# Patient Record
Sex: Female | Born: 1935 | Race: White | Hispanic: No | Marital: Married | Smoking: Former smoker
Health system: Southern US, Community
[De-identification: ages and names within clinical notes are randomized; demographics above are authoritative.]

## PROBLEM LIST (undated history)

## (undated) DIAGNOSIS — I5020 Unspecified systolic (congestive) heart failure: Secondary | ICD-10-CM

## (undated) DIAGNOSIS — I4891 Unspecified atrial fibrillation: Secondary | ICD-10-CM

## (undated) DIAGNOSIS — E876 Hypokalemia: Secondary | ICD-10-CM

## (undated) DIAGNOSIS — R600 Localized edema: Secondary | ICD-10-CM

## (undated) DIAGNOSIS — R1030 Lower abdominal pain, unspecified: Secondary | ICD-10-CM

## (undated) DIAGNOSIS — Z7901 Long term (current) use of anticoagulants: Secondary | ICD-10-CM

## (undated) DIAGNOSIS — E7800 Pure hypercholesterolemia, unspecified: Secondary | ICD-10-CM

## (undated) DIAGNOSIS — I447 Left bundle-branch block, unspecified: Secondary | ICD-10-CM

## (undated) DIAGNOSIS — R0600 Dyspnea, unspecified: Secondary | ICD-10-CM

## (undated) DIAGNOSIS — K649 Unspecified hemorrhoids: Secondary | ICD-10-CM

## (undated) DIAGNOSIS — K589 Irritable bowel syndrome without diarrhea: Secondary | ICD-10-CM

## (undated) DIAGNOSIS — R05 Cough: Secondary | ICD-10-CM

## (undated) DIAGNOSIS — J189 Pneumonia, unspecified organism: Secondary | ICD-10-CM

## (undated) DIAGNOSIS — E871 Hypo-osmolality and hyponatremia: Principal | ICD-10-CM

## (undated) DIAGNOSIS — R531 Weakness: Secondary | ICD-10-CM

## (undated) DIAGNOSIS — I472 Ventricular tachycardia: Secondary | ICD-10-CM

## (undated) DIAGNOSIS — I25810 Atherosclerosis of coronary artery bypass graft(s) without angina pectoris: Secondary | ICD-10-CM

## (undated) DIAGNOSIS — R5381 Other malaise: Secondary | ICD-10-CM

## (undated) HISTORY — DX: Hypo-osmolality and hyponatremia: E87.1

## (undated) HISTORY — DX: Hypokalemia: E87.6

## (undated) HISTORY — DX: Dyspnea, unspecified: R06.00

## (undated) HISTORY — DX: Pneumonia, unspecified organism: J18.9

## (undated) HISTORY — DX: Atherosclerosis of coronary artery bypass graft(s) without angina pectoris: I25.810

## (undated) HISTORY — DX: Left bundle-branch block, unspecified: I44.7

## (undated) HISTORY — DX: Unspecified atrial fibrillation: I48.91

## (undated) HISTORY — DX: Lower abdominal pain, unspecified: R10.30

## (undated) HISTORY — DX: Unspecified systolic (congestive) heart failure: I50.20

## (undated) HISTORY — PX: TONSILLECTOMY AND ADENOIDECTOMY: SHX28

## (undated) HISTORY — DX: Localized edema: R60.0

## (undated) HISTORY — DX: Irritable bowel syndrome without diarrhea: K58.9

## (undated) HISTORY — DX: Cough: R05

## (undated) HISTORY — DX: Weakness: R53.1

## (undated) HISTORY — DX: Pure hypercholesterolemia, unspecified: E78.00

## (undated) HISTORY — PX: OTHER SURGICAL HISTORY: SHX169

## (undated) HISTORY — DX: Other malaise: R53.81

## (undated) HISTORY — DX: Ventricular tachycardia: I47.2

## (undated) HISTORY — DX: Long term (current) use of anticoagulants: Z79.01

---

## 1997-11-21 ENCOUNTER — Other Ambulatory Visit: Admission: RE | Admit: 1997-11-21 | Discharge: 1997-11-21 | Payer: Self-pay | Admitting: Obstetrics and Gynecology

## 1998-10-21 ENCOUNTER — Ambulatory Visit (HOSPITAL_COMMUNITY): Admission: RE | Admit: 1998-10-21 | Discharge: 1998-10-21 | Payer: Self-pay | Admitting: Gastroenterology

## 1998-10-21 ENCOUNTER — Encounter (INDEPENDENT_AMBULATORY_CARE_PROVIDER_SITE_OTHER): Payer: Self-pay | Admitting: Specialist

## 1999-03-29 ENCOUNTER — Other Ambulatory Visit: Admission: RE | Admit: 1999-03-29 | Discharge: 1999-03-29 | Payer: Self-pay | Admitting: Obstetrics and Gynecology

## 2000-10-18 ENCOUNTER — Encounter: Payer: Self-pay | Admitting: Geriatric Medicine

## 2000-10-18 ENCOUNTER — Encounter: Admission: RE | Admit: 2000-10-18 | Discharge: 2000-10-18 | Payer: Self-pay | Admitting: Geriatric Medicine

## 2001-04-03 ENCOUNTER — Other Ambulatory Visit: Admission: RE | Admit: 2001-04-03 | Discharge: 2001-04-03 | Payer: Self-pay | Admitting: Obstetrics and Gynecology

## 2001-05-14 ENCOUNTER — Ambulatory Visit (HOSPITAL_COMMUNITY): Admission: RE | Admit: 2001-05-14 | Discharge: 2001-05-14 | Payer: Self-pay | Admitting: Specialist

## 2001-07-23 ENCOUNTER — Ambulatory Visit (HOSPITAL_COMMUNITY): Admission: RE | Admit: 2001-07-23 | Discharge: 2001-07-23 | Payer: Self-pay | Admitting: Specialist

## 2001-08-14 ENCOUNTER — Ambulatory Visit (HOSPITAL_COMMUNITY): Admission: RE | Admit: 2001-08-14 | Discharge: 2001-08-14 | Payer: Self-pay | Admitting: Gastroenterology

## 2001-08-14 ENCOUNTER — Encounter (INDEPENDENT_AMBULATORY_CARE_PROVIDER_SITE_OTHER): Payer: Self-pay | Admitting: Specialist

## 2002-10-16 ENCOUNTER — Ambulatory Visit (HOSPITAL_COMMUNITY): Admission: RE | Admit: 2002-10-16 | Discharge: 2002-10-16 | Payer: Self-pay | Admitting: Gastroenterology

## 2002-10-16 ENCOUNTER — Encounter (INDEPENDENT_AMBULATORY_CARE_PROVIDER_SITE_OTHER): Payer: Self-pay | Admitting: Specialist

## 2002-10-29 ENCOUNTER — Encounter: Payer: Self-pay | Admitting: Geriatric Medicine

## 2002-10-29 ENCOUNTER — Ambulatory Visit (HOSPITAL_COMMUNITY): Admission: RE | Admit: 2002-10-29 | Discharge: 2002-10-29 | Payer: Self-pay | Admitting: Geriatric Medicine

## 2004-03-15 ENCOUNTER — Encounter: Admission: RE | Admit: 2004-03-15 | Discharge: 2004-03-15 | Payer: Self-pay | Admitting: Internal Medicine

## 2004-05-13 ENCOUNTER — Encounter: Admission: RE | Admit: 2004-05-13 | Discharge: 2004-07-27 | Payer: Self-pay | Admitting: Internal Medicine

## 2004-11-18 ENCOUNTER — Encounter: Admission: RE | Admit: 2004-11-18 | Discharge: 2004-11-18 | Payer: Self-pay | Admitting: Internal Medicine

## 2004-12-03 ENCOUNTER — Encounter (INDEPENDENT_AMBULATORY_CARE_PROVIDER_SITE_OTHER): Payer: Self-pay | Admitting: Specialist

## 2004-12-03 ENCOUNTER — Ambulatory Visit (HOSPITAL_COMMUNITY): Admission: RE | Admit: 2004-12-03 | Discharge: 2004-12-03 | Payer: Self-pay | Admitting: Gastroenterology

## 2004-12-07 ENCOUNTER — Encounter: Admission: RE | Admit: 2004-12-07 | Discharge: 2005-01-05 | Payer: Self-pay | Admitting: Internal Medicine

## 2005-01-06 ENCOUNTER — Encounter: Admission: RE | Admit: 2005-01-06 | Discharge: 2005-02-01 | Payer: Self-pay | Admitting: Internal Medicine

## 2006-01-20 ENCOUNTER — Encounter: Admission: RE | Admit: 2006-01-20 | Discharge: 2006-01-20 | Payer: Self-pay | Admitting: Gastroenterology

## 2006-02-14 ENCOUNTER — Encounter: Admission: RE | Admit: 2006-02-14 | Discharge: 2006-02-14 | Payer: Self-pay | Admitting: Internal Medicine

## 2008-05-08 IMAGING — CT CT ABDOMEN W/ CM
2 of 5 series · 17 of 46 positions shown, 19 images · IV contrast (READY/WATER & [ID] OMNI 300)
Comparison: None

ABDOMEN CT WITH CONTRAST

CLINICAL DATA: Right lower quadrant pain
TECHNIQUE: Multidetector CT imaging of the abdomen and pelvis was performed
following the standard protocol during bolus administration of intravenous
contrast.

Contrast:  100 cc Omnipaque 300

[Series 102: a&p w/ · axial · 0.66mm/px · z∈[-430,-69]mm · 14 of 317 slices shown, 16 images]
[im 14/317  soft-tissue]
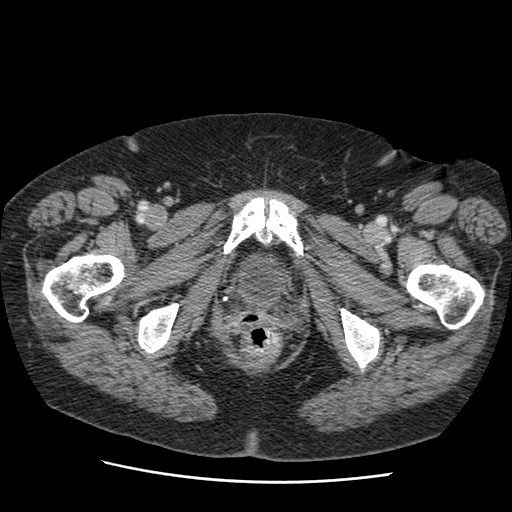
[im 14/317  bone]
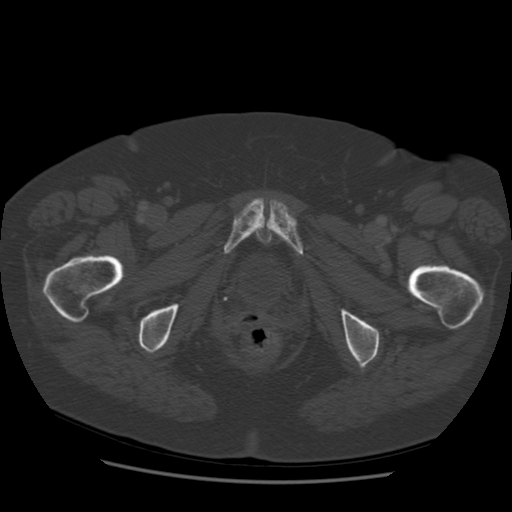
[im 42/317  soft-tissue]
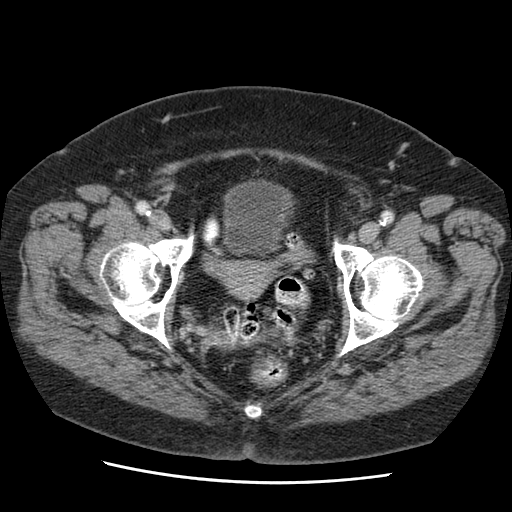
[im 55/317  soft-tissue]
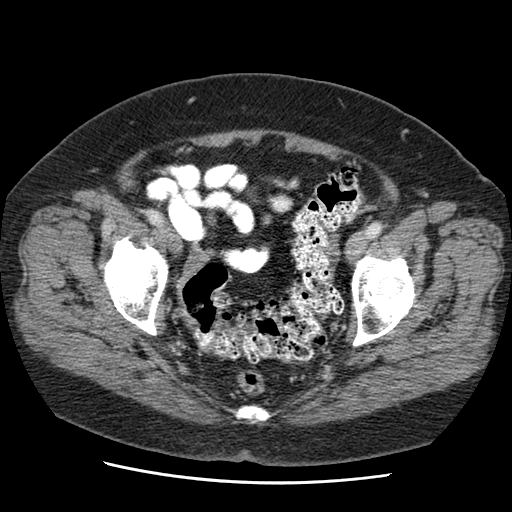
[im 83/317  soft-tissue]
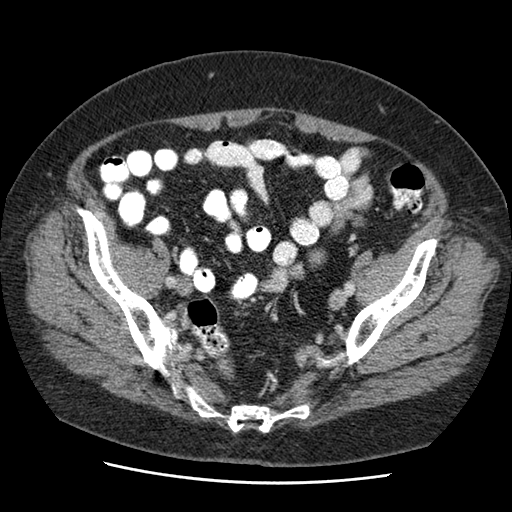
[im 110/317  soft-tissue]
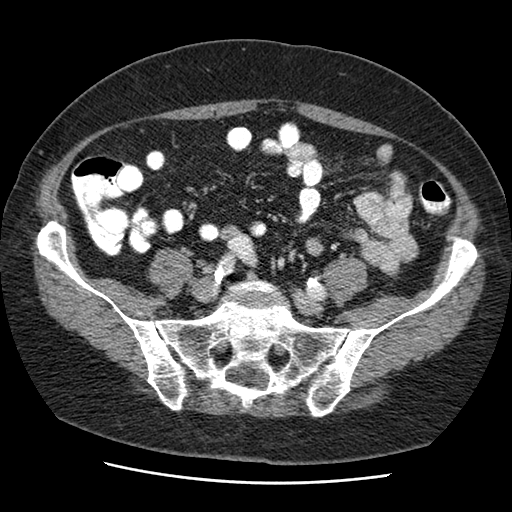
[im 124/317  soft-tissue]
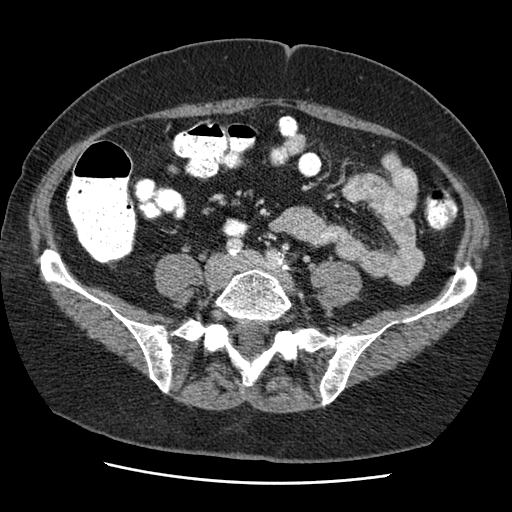
[im 152/317  soft-tissue]
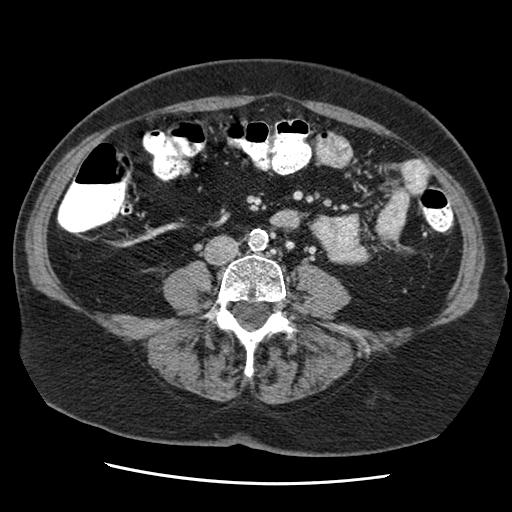
[im 165/317  soft-tissue]
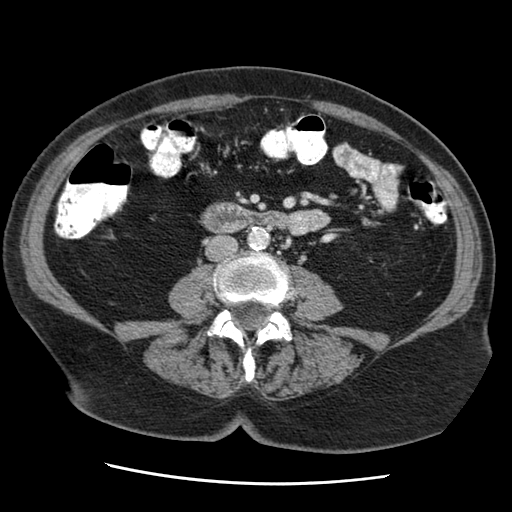
[im 193/317  soft-tissue]
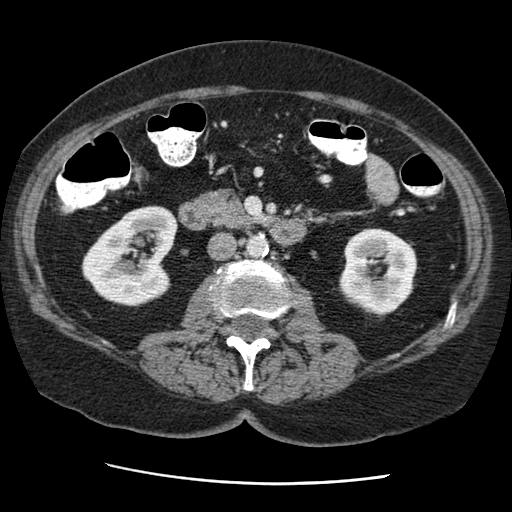
[im 193/317  bone]
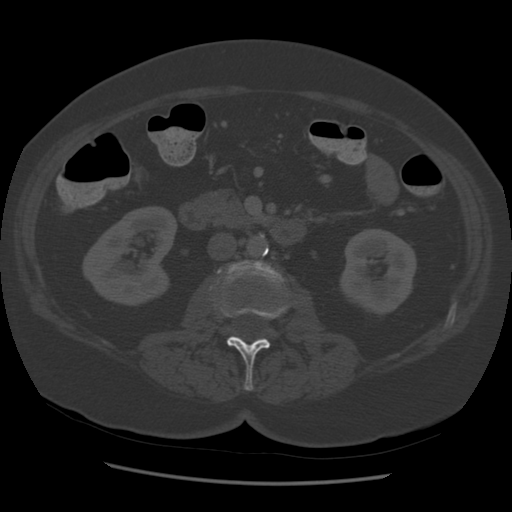
[im 207/317  soft-tissue]
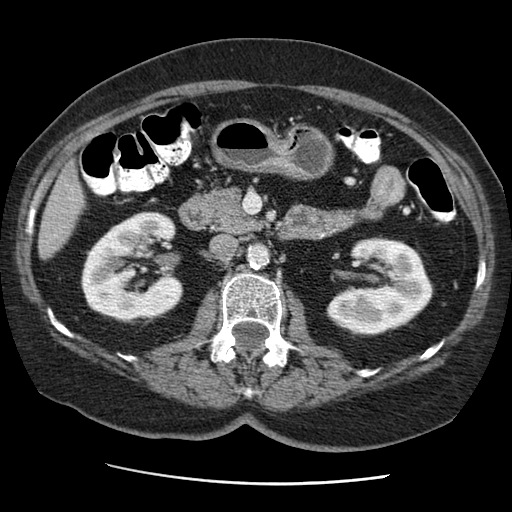
[im 234/317  soft-tissue]
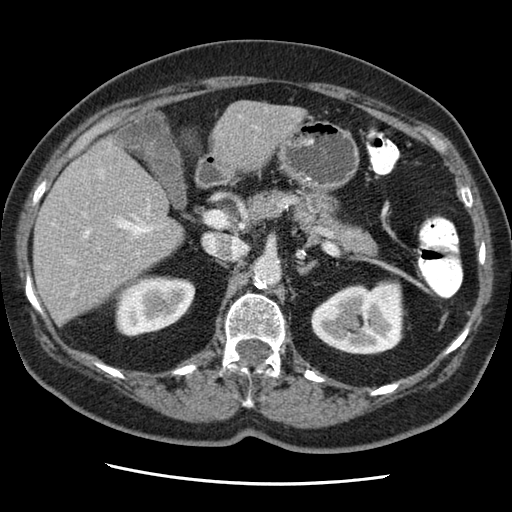
[im 262/317  soft-tissue]
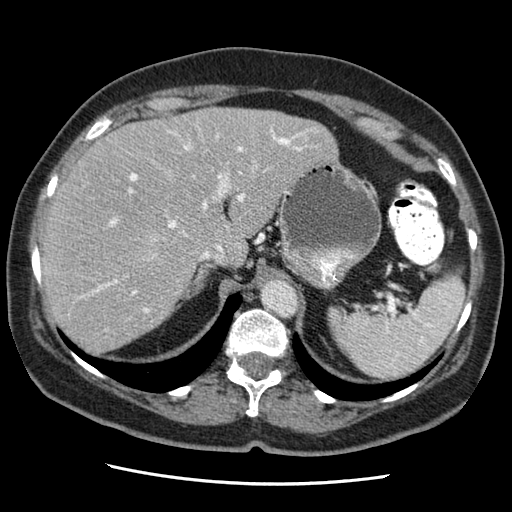
[im 275/317  soft-tissue]
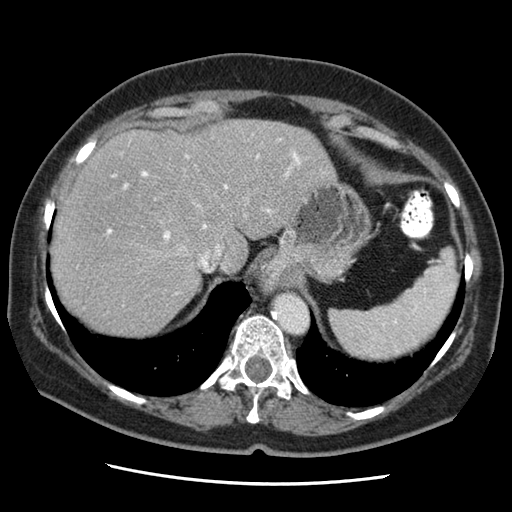
[im 303/317  soft-tissue]
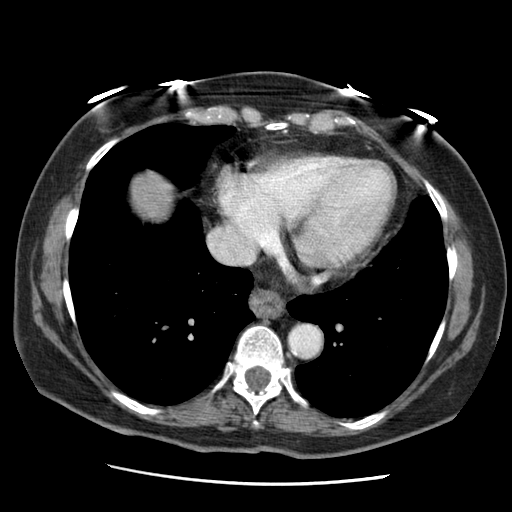

[Series 252: reformatted · sagittal · 0.77mm/px · 3 of 128 slices shown]
[im 43/128  soft-tissue]
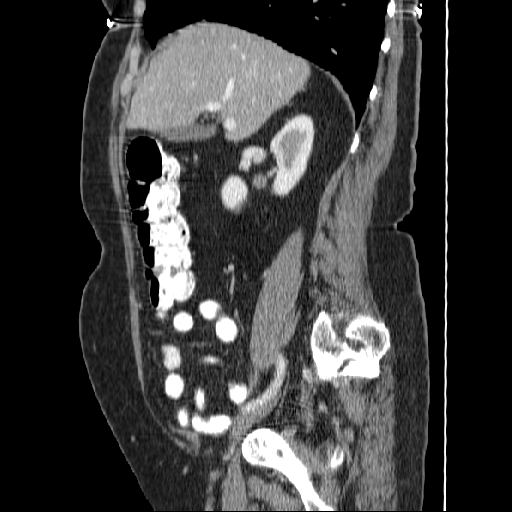
[im 57/128  soft-tissue]
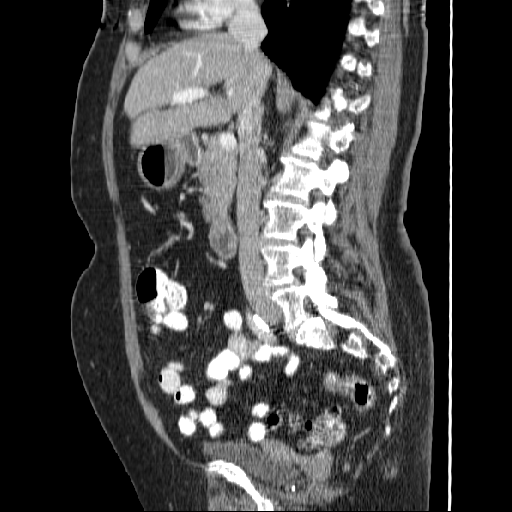
[im 71/128  soft-tissue]
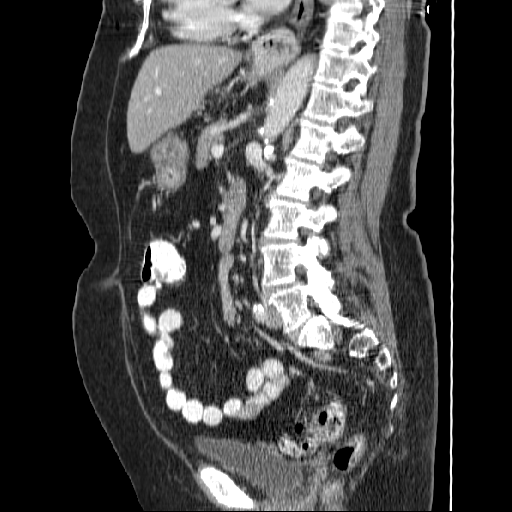

[17 of 46 positions shown; findings below may reference images not displayed]

FINDINGS: Large hiatal hernia is present. Liver, spleen, pancreas, adrenals,
kidneys unremarkable except for small benign appearing cysts in the kidneys
bilaterally. Mild atherosclerotic disease throughout the aorta without evidence
of aneurysm. No free fluid, free air, or adenopathy. Bowel grossly unremarkable.
Lung bases clear.

IMPRESSION

Large hiatal hernia. No acute findings.

PELVIS CT WITH CONTRAST
FINDINGS: There is prominent rounded fat at the ileocecal valve, most likely
representing a lipoma. Small diverticulum within the cecum without evidence of
active diverticulitis. Moderate diverticular disease noted throughout the
sigmoid colon without active diverticulitis as well. Uterus and adnexa are
unremarkable. Appendix not visualized.

IMPRESSION

Ileocecal valve lipoma.

Diverticulosis. No active diverticulitis.

No acute findings.

## 2009-07-14 ENCOUNTER — Encounter: Admission: RE | Admit: 2009-07-14 | Discharge: 2009-09-03 | Payer: Self-pay | Admitting: Internal Medicine

## 2009-09-30 ENCOUNTER — Ambulatory Visit (HOSPITAL_COMMUNITY): Admission: RE | Admit: 2009-09-30 | Discharge: 2009-09-30 | Payer: Self-pay | Admitting: Psychiatry

## 2010-09-17 ENCOUNTER — Other Ambulatory Visit: Payer: Self-pay | Admitting: Internal Medicine

## 2010-09-17 DIAGNOSIS — M549 Dorsalgia, unspecified: Secondary | ICD-10-CM

## 2010-09-17 NOTE — Op Note (Signed)
Darlene Collins, Darlene Collins NO.:  [id number]   MEDICAL RECORD NO.:  [id number]          PATIENT TYPE:  AMB   LOCATION:  ENDO                         FACILITY:  [facility]   PHYSICIAN:  Bernette Redbird, M.D.   DATE OF BIRTH:  07-Dec-1928   DATE OF PROCEDURE:  12/03/2004  DATE OF DISCHARGE:                                 OPERATIVE REPORT   PROCEDURE:  Upper endoscopy with Savary dilatation of the esophagus under  fluoroscopy.   INDICATIONS:  A 77 year old female with longstanding intermittent dysphagia  symptoms.   FINDINGS:  Prominent esophageal ring above a small hiatal hernia, dilated to  18 mm.   PROCEDURE:  The nature, purpose, and risks of the procedure had been  discussed with the patient who provided written consent.  Sedation was  fentanyl 50 mcg and Versed 5 mg IV prior to and during the course of the  procedure without arrhythmias or desaturation.  The Olympus video endoscope  was passed under direct vision.  The vocal cords were not well seen.  The  esophagus was entered without significant difficulty and had entirely normal  mucosa without evidence of reflux esophagitis, Barrett's esophagus, varices,  infection, neoplasia, or any exudative changes or ulceration at the GE  junction.  However, at the GE junction, there was a prominent esophageal  ring, encroaching on the lumen considerably but not offering resistance to  passage of the 9 mm endoscope.  Below this was a 3-cm hiatal hernia.  The  stomach contained a small clear residual which was suctioned up.  The  gastric mucosa was essentially normal with just a little bit of patchy  erythema but no erosions, ulcers, polyps, or masses noted including a  retroflexed view of the proximal stomach.  The pylorus, duodenal bulb, and  second duodenum looked normal.   Savary dilatation was then performed by passing the spring tip guidewire  through the scope, removing the scope in an exchange fashion, confirming  appropriate positioning of the wire fluoroscopically, and then passing  Savary dilators, sizes 16 and 18 mm, over the wire under fluoroscopic  guidance, such that the widest portion of the dilator was confirmed to go  below the level of the diaphragm.  There was some slight resistance or a  slight pop with passage of the larger dilator.   The patient was then reendoscoped under direct vision which showed some  fresh hemorrhage at the level of the esophageal ring, without evidence of  active ongoing bleeding and without endoscopic evidence of undue trauma to  the stomach, esophagus, or hypopharynx.   The patient tolerated the procedure well, and there were no apparent  complications.   IMPRESSION:  Esophageal ring, dilated to 18 mm, consistent with the  patient's history of dysphagia symptoms.   PLAN:  Clinical follow-up of dysphagia symptoms.       RB/MEDQ  D:  12/03/2004  T:  12/03/2004  Job:  [id number]   cc:   Loraine Leriche A. Waynard Edwards, M.D.  [address]  [place]  [place] [address]  Fax: [phone]

## 2010-09-17 NOTE — Procedures (Signed)
[facility]  Patient:    CATALIA, MASSETT Visit Number: [id number] MRN: [id number]          Service Type: END Location: ENDO Attending Physician:  Rich Brave Dictated by:   Florencia Reasons, M.D. Proc. Date: 08/14/01 Admit Date:  08/14/2001   CC:         Hal T. Stoneking, M.D.   Procedure Report  PROCEDURE:  Colonoscopy with biopsy and polypectomy.  INDICATIONS:  A 77 year old female with longstanding ulcerative colitis; for dysplasia surveillance, with her most recent colonoscopy having been about three years ago.  FINDINGS:  Rectal polyps.  Sigmoid diverticulosis.  Localized inflammation at 25 cm.  DESCRIPTION OF PROCEDURE:  The nature, purpose and risks of the procedure were familiar to the patient who provided written consent.  Sedation for this procedure was fentanyl 75 mcg and Versed 7 mg IV; without arrhythmias or desaturation.  The Olympus adjustable tension pediatric video colonoscope was advanced with mild difficulty through a somewhat fixated and angulated sigmoid region, with left diverticular disease.  Went around the colon to the cecum, which was identified by visualization of the ileocecal valve.  Pullback was then performed.  The quality of the prep was very good, and it is felt that all areas were well seen.  Random mucosal biopsies were obtained along the length of the colon.  Overall, there was no evidence of colitis activity -- except for a very short segment of about 3 cm at about 25 cm from the external anal opening, where there was erythema and granularity fairly suggestive of ulcerative colitis. Biopsies were obtained from this area separately.  There was also fairly significant sigmoid diverticulosis, with associated muscular thickening.  In the rectum was an 8 mm semipedunculated pale polyp, snared off and multiple smaller adjacent sessile areas of mucosal irregularity biopsied to rule out adenomatous  change.  Retroflexion was unremarkable apart from those changes.  The patient tolerated the procedure well and there were no apparent complications.  IMPRESSION: 1. Localized inflammation at 25 cm. 2. Sigmoid diverticulosis. 3. Rectal polyps.  PLAN:  Await pathology. Dictated by:   Florencia Reasons, M.D. Attending Physician:  Rich Brave DD:  08/14/01 TD:  08/14/01 Job: [id number]

## 2010-09-30 ENCOUNTER — Ambulatory Visit
Admission: RE | Admit: 2010-09-30 | Discharge: 2010-09-30 | Disposition: A | Discharge status: Home / Self Care | Payer: Medicare Other | Source: Ambulatory Visit | Attending: Internal Medicine | Admitting: Internal Medicine

## 2010-09-30 DIAGNOSIS — M549 Dorsalgia, unspecified: Secondary | ICD-10-CM

## 2011-03-04 ENCOUNTER — Other Ambulatory Visit: Payer: Self-pay | Admitting: Gastroenterology

## 2011-05-18 DIAGNOSIS — L259 Unspecified contact dermatitis, unspecified cause: Secondary | ICD-10-CM | POA: Diagnosis not present

## 2011-05-18 DIAGNOSIS — L821 Other seborrheic keratosis: Secondary | ICD-10-CM | POA: Diagnosis not present

## 2011-07-04 DIAGNOSIS — M899 Disorder of bone, unspecified: Secondary | ICD-10-CM | POA: Diagnosis not present

## 2011-07-14 DIAGNOSIS — H04129 Dry eye syndrome of unspecified lacrimal gland: Secondary | ICD-10-CM | POA: Diagnosis not present

## 2011-08-08 DIAGNOSIS — R635 Abnormal weight gain: Secondary | ICD-10-CM | POA: Diagnosis not present

## 2011-08-08 DIAGNOSIS — L299 Pruritus, unspecified: Secondary | ICD-10-CM | POA: Diagnosis not present

## 2011-08-08 DIAGNOSIS — R300 Dysuria: Secondary | ICD-10-CM | POA: Diagnosis not present

## 2011-08-08 DIAGNOSIS — R209 Unspecified disturbances of skin sensation: Secondary | ICD-10-CM | POA: Diagnosis not present

## 2011-08-08 DIAGNOSIS — B379 Candidiasis, unspecified: Secondary | ICD-10-CM | POA: Diagnosis not present

## 2011-08-30 DIAGNOSIS — R5383 Other fatigue: Secondary | ICD-10-CM | POA: Diagnosis not present

## 2011-08-30 DIAGNOSIS — R5381 Other malaise: Secondary | ICD-10-CM | POA: Diagnosis not present

## 2011-08-30 DIAGNOSIS — L509 Urticaria, unspecified: Secondary | ICD-10-CM | POA: Diagnosis not present

## 2011-08-30 DIAGNOSIS — T887XXA Unspecified adverse effect of drug or medicament, initial encounter: Secondary | ICD-10-CM | POA: Diagnosis not present

## 2011-08-30 DIAGNOSIS — L259 Unspecified contact dermatitis, unspecified cause: Secondary | ICD-10-CM | POA: Diagnosis not present

## 2011-09-14 DIAGNOSIS — Z1231 Encounter for screening mammogram for malignant neoplasm of breast: Secondary | ICD-10-CM | POA: Diagnosis not present

## 2011-09-20 DIAGNOSIS — L259 Unspecified contact dermatitis, unspecified cause: Secondary | ICD-10-CM | POA: Diagnosis not present

## 2011-09-29 DIAGNOSIS — R05 Cough: Secondary | ICD-10-CM | POA: Diagnosis not present

## 2011-09-29 DIAGNOSIS — R635 Abnormal weight gain: Secondary | ICD-10-CM | POA: Diagnosis not present

## 2011-09-29 DIAGNOSIS — L299 Pruritus, unspecified: Secondary | ICD-10-CM | POA: Diagnosis not present

## 2011-09-29 DIAGNOSIS — B379 Candidiasis, unspecified: Secondary | ICD-10-CM | POA: Diagnosis not present

## 2011-10-07 DIAGNOSIS — M1710 Unilateral primary osteoarthritis, unspecified knee: Secondary | ICD-10-CM | POA: Diagnosis not present

## 2011-10-07 DIAGNOSIS — IMO0002 Reserved for concepts with insufficient information to code with codable children: Secondary | ICD-10-CM | POA: Diagnosis not present

## 2011-10-27 ENCOUNTER — Telehealth: Payer: Self-pay | Admitting: Internal Medicine

## 2011-10-27 NOTE — Telephone Encounter (Signed)
Spoke with pt. She states that she had called Dr. Waynard Edwards to get records sent to [place] including copy of cxr report. She states that "[place] won't allow this" and we have to call and request. I called Dr. Laurey Morale office and had to [facility] requesting the records to be faxed to up front fax. Nothing further needed.

## 2011-10-31 ENCOUNTER — Ambulatory Visit (INDEPENDENT_AMBULATORY_CARE_PROVIDER_SITE_OTHER): Payer: Medicare Other | Admitting: Internal Medicine

## 2011-10-31 ENCOUNTER — Encounter: Payer: Self-pay | Admitting: Internal Medicine

## 2011-10-31 VITALS — BP 138/78 | HR 101 | Ht 63.5 in | Wt 171.4 lb

## 2011-10-31 DIAGNOSIS — R05 Cough: Secondary | ICD-10-CM | POA: Diagnosis not present

## 2011-10-31 DIAGNOSIS — R059 Cough, unspecified: Secondary | ICD-10-CM

## 2011-10-31 DIAGNOSIS — R053 Chronic cough: Secondary | ICD-10-CM

## 2011-10-31 MED ORDER — BENZONATATE 200 MG PO CAPS: 200.0000 mg | ORAL_CAPSULE | Freq: Three times a day (TID) | ORAL | Status: AC | PRN

## 2011-10-31 NOTE — Patient Instructions (Addendum)
Script for benzonatate perles for cough   Try taking this 20 - 30 minutes before meals of bedtime and see if it seems to reduce tendency to cough.  Order- schedule PFT  Dx cough  Sample Tudorza inhaler-   1 puff two times per day, every day   Watch to see if it reduces your tendency to cough

## 2011-10-31 NOTE — Progress Notes (Signed)
10/31/11- 55 yoF former smoker seen for pulmonary consultation  Referred by Dr. Waynard Edwards for cough. c/o cough with white/yellow mucus and wheezing. Denies chest pain and chest tightness.  She had quit smoking 35 years ago and denies history of asthma or pneumonia. She thinks she has had the pneumonia vaccine. Has not had frequent colds. For the past year she has noted coughing with thick white phlegm which chokes her. Other doctors have told her reflux was the reason she coughed. She works with Dr. Matthias Hughs for irritable bowel. Cough is episodic and she is clear between episodes. It tends to happen during sleep or with meals. She has a remote history of esophageal dilatation.. She feels that occasionally when eating food "blocks" and then she coughs until she brings up phlegm. She has not noticed frank reflux. She denies shortness of breath, wheeze or postnasal drip. She has not tried medications for this Chest x-ray 09/29/2011-no active disease.  Prior to Admission medications   Medication Sig Start Date End Date Taking? Authorizing Provider  EVISTA 60 MG tablet 3 times a week 07/24/11  Yes Historical Provider, MD  ezetimibe (ZETIA) 10 MG tablet Take 10 mg by mouth. 3 times a week   Yes Historical Provider, MD  Multiple Vitamins-Minerals (CENTRUM SILVER PO) Take by mouth daily.   Yes Historical Provider, MD  naproxen sodium (ANAPROX) 220 MG tablet Take 220 mg by mouth as needed.   Yes Historical Provider, MD  rosuvastatin (CRESTOR) 5 MG tablet Take 5 mg by mouth. 3 times a week   Yes Historical Provider, MD  Vitamin D, Ergocalciferol, (DRISDOL) 50000 UNITS CAPS Once a week. 09/19/11  Yes Historical Provider, MD  benzonatate (TESSALON) 200 MG capsule Take 1 capsule (200 mg total) by mouth 3 (three) times daily as needed for cough. 10/31/11 11/07/11  Waymon Budge, MD   Past Medical History  Diagnosis Date  . High cholesterol    Past Surgical History  Procedure Date  . Tonsillectomy and adenoidectomy     . Cataracts    Family History  Problem Relation Age of Onset  . Heart attack Father    History   Social History  . Marital Status: Married    Spouse Name: N/A    Number of Children: 2  . Years of Education: N/A   Occupational History  . Not on file.   Social History Main Topics  . Smoking status: Former Smoker -- 1.0 packs/day for 25 years    Types: Cigarettes    Quit date: 10/30/1976  . Smokeless tobacco: Never Used  . Alcohol Use: Yes     wine  . Drug Use: No  . Sexually Active: Not on file   Other Topics Concern  . Not on file   Social History Narrative  . No narrative on file   ROS-see HPI Constitutional:   No-   weight loss, night sweats, fevers, chills, fatigue, lassitude. HEENT:   No-  headaches, difficulty swallowing, tooth/dental problems, sore throat,       No-  sneezing, itching, ear ache, nasal congestion,+ post nasal drip,  CV:  No-   chest pain, orthopnea, PND, swelling in lower extremities, anasarca, dizziness, palpitations Resp: No-   shortness of breath with exertion or at rest.            +   productive cough,  + non-productive cough,  No- coughing up of blood.              No-  change in color of mucus.  No- wheezing.   Skin: + rash or lesions. GI:  No-   heartburn, indigestion, abdominal pain, nausea, vomiting, diarrhea,                 change in bowel habits, loss of appetite, +dysphagia  GU: No-   dysuria, change in color of urine, no urgency or frequency.  No- flank pain. MS:  No-   joint pain or swelling.  No- decreased range of motion.  No- back pain. Neuro-     nothing unusual Psych:  No- change in mood or affect. No depression or anxiety.  No memory loss.  OBJ- Physical Exam General- Alert, Oriented, Affect-appropriate, Distress- none acute Skin- rash-none, lesions- none, excoriation- none Lymphadenopathy- none Head- atraumatic            Eyes- Gross vision intact, PERRLA, conjunctivae and secretions clear            Ears- Hearing,  canals-normal            Nose- Clear, no-Septal dev, mucus, polyps, erosion, perforation             Throat- Mallampati II-III , mucosa clear , drainage- none, tonsils- atrophic Neck- flexible , trachea midline, no stridor , thyroid nl, carotid no bruit Chest - symmetrical excursion , unlabored           Heart/CV- RRR/ few extra beats , no murmur , no gallop  , no rub, nl s1 s2                           - JVD- none , edema- none, stasis changes- none, varices- none           Lung- clear to P&A, wheeze- none, cough- none , dullness-none, rub- none           Chest wall-  Abd- tender-no, distended-no, bowel sounds-present, HSM- no Br/ Gen/ Rectal- Not done, not indicated Extrem- cyanosis- none, clubbing, none, atrophy- none, strength- nl Neuro- grossly intact to observation

## 2011-11-03 DIAGNOSIS — R053 Chronic cough: Secondary | ICD-10-CM

## 2011-11-03 DIAGNOSIS — R05 Cough: Secondary | ICD-10-CM | POA: Insufficient documentation

## 2011-11-03 HISTORY — DX: Chronic cough: R05.3

## 2011-11-03 NOTE — Assessment & Plan Note (Signed)
On reviewing her history, it certainly seems as if reflux may be an important trigger, positionally and with meals. She may also have a stricture. I have asked her to followup with her gastroenterologist on this. She may need a swallowing evaluation. Plan-schedule PFT, sample Tudorza, tried benzonatate for cough suppression. Reflux precautions were discussed

## 2011-11-04 ENCOUNTER — Ambulatory Visit (INDEPENDENT_AMBULATORY_CARE_PROVIDER_SITE_OTHER): Payer: Medicare Other | Admitting: Internal Medicine

## 2011-11-04 DIAGNOSIS — R05 Cough: Secondary | ICD-10-CM | POA: Diagnosis not present

## 2011-11-04 LAB — PULMONARY FUNCTION TEST

## 2011-11-04 NOTE — Progress Notes (Signed)
PFT done today. 

## 2011-11-08 ENCOUNTER — Telehealth: Payer: Self-pay | Admitting: Internal Medicine

## 2011-11-08 NOTE — Telephone Encounter (Signed)
Pt is requesting pft results from 11/04/11. Please advise Dr. Maple Hudson thanks

## 2011-11-08 NOTE — Telephone Encounter (Signed)
Spoke with patient-aware that records have not been scanned to [facility] as of today-should be in [facility] by Friday and I will call her personally with results.

## 2011-11-11 NOTE — Telephone Encounter (Signed)
Spoke with pt about her PFT results per CY.  Pt voiced her understanding of these results and will keep her appt with CY in sept for her follow up to discuss further.  Pt is aware that results of the PFT have been sent to Dr. Waynard Edwards per pts request.

## 2011-11-11 NOTE — Telephone Encounter (Signed)
LMTCB-need to let patient know that her PFT showed mild obstructive airway disease with response to bronchodilator; normal lung volume and diffusion. Pt should keep next OV with CY to discuss in more detail.

## 2012-01-03 ENCOUNTER — Ambulatory Visit: Payer: Medicare Other | Admitting: Internal Medicine

## 2012-01-24 DIAGNOSIS — Z23 Encounter for immunization: Secondary | ICD-10-CM | POA: Diagnosis not present

## 2012-02-29 DIAGNOSIS — K5100 Ulcerative (chronic) pancolitis without complications: Secondary | ICD-10-CM | POA: Diagnosis not present

## 2012-02-29 DIAGNOSIS — K5902 Outlet dysfunction constipation: Secondary | ICD-10-CM | POA: Diagnosis not present

## 2012-03-06 DIAGNOSIS — J329 Chronic sinusitis, unspecified: Secondary | ICD-10-CM | POA: Diagnosis not present

## 2012-03-06 DIAGNOSIS — R51 Headache: Secondary | ICD-10-CM | POA: Diagnosis not present

## 2012-04-15 DIAGNOSIS — R1310 Dysphagia, unspecified: Secondary | ICD-10-CM | POA: Diagnosis not present

## 2012-04-18 DIAGNOSIS — R1310 Dysphagia, unspecified: Secondary | ICD-10-CM | POA: Diagnosis not present

## 2012-04-18 DIAGNOSIS — R05 Cough: Secondary | ICD-10-CM | POA: Diagnosis not present

## 2012-06-19 DIAGNOSIS — E785 Hyperlipidemia, unspecified: Secondary | ICD-10-CM | POA: Diagnosis not present

## 2012-06-19 DIAGNOSIS — R82998 Other abnormal findings in urine: Secondary | ICD-10-CM | POA: Diagnosis not present

## 2012-06-19 DIAGNOSIS — M810 Age-related osteoporosis without current pathological fracture: Secondary | ICD-10-CM | POA: Diagnosis not present

## 2012-06-26 DIAGNOSIS — M1990 Unspecified osteoarthritis, unspecified site: Secondary | ICD-10-CM | POA: Diagnosis not present

## 2012-06-26 DIAGNOSIS — Z124 Encounter for screening for malignant neoplasm of cervix: Secondary | ICD-10-CM | POA: Diagnosis not present

## 2012-06-26 DIAGNOSIS — Z0000 Encounter for general adult medical examination without abnormal findings: Secondary | ICD-10-CM | POA: Diagnosis not present

## 2012-06-26 DIAGNOSIS — E559 Vitamin D deficiency, unspecified: Secondary | ICD-10-CM | POA: Diagnosis not present

## 2012-06-28 DIAGNOSIS — Z23 Encounter for immunization: Secondary | ICD-10-CM | POA: Diagnosis not present

## 2012-06-29 DIAGNOSIS — Z1212 Encounter for screening for malignant neoplasm of rectum: Secondary | ICD-10-CM | POA: Diagnosis not present

## 2012-07-12 DIAGNOSIS — R1310 Dysphagia, unspecified: Secondary | ICD-10-CM | POA: Diagnosis not present

## 2012-07-12 DIAGNOSIS — J387 Other diseases of larynx: Secondary | ICD-10-CM | POA: Diagnosis not present

## 2012-07-12 DIAGNOSIS — K5100 Ulcerative (chronic) pancolitis without complications: Secondary | ICD-10-CM | POA: Diagnosis not present

## 2012-07-12 DIAGNOSIS — K219 Gastro-esophageal reflux disease without esophagitis: Secondary | ICD-10-CM | POA: Diagnosis not present

## 2012-07-19 DIAGNOSIS — H02839 Dermatochalasis of unspecified eye, unspecified eyelid: Secondary | ICD-10-CM | POA: Diagnosis not present

## 2012-08-24 DIAGNOSIS — W19XXXA Unspecified fall, initial encounter: Secondary | ICD-10-CM | POA: Diagnosis not present

## 2012-08-24 DIAGNOSIS — J3489 Other specified disorders of nose and nasal sinuses: Secondary | ICD-10-CM | POA: Diagnosis not present

## 2012-08-24 DIAGNOSIS — S022XXA Fracture of nasal bones, initial encounter for closed fracture: Secondary | ICD-10-CM | POA: Diagnosis not present

## 2012-09-20 DIAGNOSIS — Z1231 Encounter for screening mammogram for malignant neoplasm of breast: Secondary | ICD-10-CM | POA: Diagnosis not present

## 2012-10-15 DIAGNOSIS — E785 Hyperlipidemia, unspecified: Secondary | ICD-10-CM | POA: Diagnosis not present

## 2012-10-17 DIAGNOSIS — H0019 Chalazion unspecified eye, unspecified eyelid: Secondary | ICD-10-CM | POA: Diagnosis not present

## 2013-02-08 DIAGNOSIS — K5902 Outlet dysfunction constipation: Secondary | ICD-10-CM | POA: Diagnosis not present

## 2013-02-08 DIAGNOSIS — Z23 Encounter for immunization: Secondary | ICD-10-CM | POA: Diagnosis not present

## 2013-02-08 DIAGNOSIS — K219 Gastro-esophageal reflux disease without esophagitis: Secondary | ICD-10-CM | POA: Diagnosis not present

## 2013-02-08 DIAGNOSIS — K5100 Ulcerative (chronic) pancolitis without complications: Secondary | ICD-10-CM | POA: Diagnosis not present

## 2013-02-26 DIAGNOSIS — L608 Other nail disorders: Secondary | ICD-10-CM | POA: Diagnosis not present

## 2013-02-26 DIAGNOSIS — M2040 Other hammer toe(s) (acquired), unspecified foot: Secondary | ICD-10-CM | POA: Diagnosis not present

## 2013-05-20 ENCOUNTER — Other Ambulatory Visit: Payer: Self-pay | Admitting: Gastroenterology

## 2013-05-20 DIAGNOSIS — K5150 Left sided colitis without complications: Secondary | ICD-10-CM | POA: Diagnosis not present

## 2013-05-20 DIAGNOSIS — D126 Benign neoplasm of colon, unspecified: Secondary | ICD-10-CM | POA: Diagnosis not present

## 2013-05-20 DIAGNOSIS — Z8719 Personal history of other diseases of the digestive system: Secondary | ICD-10-CM | POA: Diagnosis not present

## 2013-05-20 DIAGNOSIS — Z09 Encounter for follow-up examination after completed treatment for conditions other than malignant neoplasm: Secondary | ICD-10-CM | POA: Diagnosis not present

## 2013-07-01 DIAGNOSIS — M810 Age-related osteoporosis without current pathological fracture: Secondary | ICD-10-CM | POA: Diagnosis not present

## 2013-07-01 DIAGNOSIS — Z79899 Other long term (current) drug therapy: Secondary | ICD-10-CM | POA: Diagnosis not present

## 2013-07-01 DIAGNOSIS — R82998 Other abnormal findings in urine: Secondary | ICD-10-CM | POA: Diagnosis not present

## 2013-07-01 DIAGNOSIS — E785 Hyperlipidemia, unspecified: Secondary | ICD-10-CM | POA: Diagnosis not present

## 2013-07-01 DIAGNOSIS — R809 Proteinuria, unspecified: Secondary | ICD-10-CM | POA: Diagnosis not present

## 2013-07-08 DIAGNOSIS — M810 Age-related osteoporosis without current pathological fracture: Secondary | ICD-10-CM | POA: Diagnosis not present

## 2013-07-08 DIAGNOSIS — J309 Allergic rhinitis, unspecified: Secondary | ICD-10-CM | POA: Diagnosis not present

## 2013-07-08 DIAGNOSIS — E785 Hyperlipidemia, unspecified: Secondary | ICD-10-CM | POA: Diagnosis not present

## 2013-07-08 DIAGNOSIS — M25519 Pain in unspecified shoulder: Secondary | ICD-10-CM | POA: Diagnosis not present

## 2013-07-08 DIAGNOSIS — M1990 Unspecified osteoarthritis, unspecified site: Secondary | ICD-10-CM | POA: Diagnosis not present

## 2013-07-08 DIAGNOSIS — Z124 Encounter for screening for malignant neoplasm of cervix: Secondary | ICD-10-CM | POA: Diagnosis not present

## 2013-07-08 DIAGNOSIS — M25559 Pain in unspecified hip: Secondary | ICD-10-CM | POA: Diagnosis not present

## 2013-07-08 DIAGNOSIS — Z0000 Encounter for general adult medical examination without abnormal findings: Secondary | ICD-10-CM | POA: Diagnosis not present

## 2013-07-08 DIAGNOSIS — Z79899 Other long term (current) drug therapy: Secondary | ICD-10-CM | POA: Diagnosis not present

## 2013-07-22 DIAGNOSIS — H02839 Dermatochalasis of unspecified eye, unspecified eyelid: Secondary | ICD-10-CM | POA: Diagnosis not present

## 2013-07-22 DIAGNOSIS — Z961 Presence of intraocular lens: Secondary | ICD-10-CM | POA: Diagnosis not present

## 2013-08-07 DIAGNOSIS — M810 Age-related osteoporosis without current pathological fracture: Secondary | ICD-10-CM | POA: Diagnosis not present

## 2013-09-04 DIAGNOSIS — M545 Low back pain, unspecified: Secondary | ICD-10-CM | POA: Diagnosis not present

## 2013-09-09 DIAGNOSIS — M25559 Pain in unspecified hip: Secondary | ICD-10-CM | POA: Diagnosis not present

## 2013-09-09 DIAGNOSIS — M545 Low back pain, unspecified: Secondary | ICD-10-CM | POA: Diagnosis not present

## 2013-09-09 DIAGNOSIS — M6281 Muscle weakness (generalized): Secondary | ICD-10-CM | POA: Diagnosis not present

## 2013-09-11 DIAGNOSIS — M545 Low back pain, unspecified: Secondary | ICD-10-CM | POA: Diagnosis not present

## 2013-09-11 DIAGNOSIS — M25559 Pain in unspecified hip: Secondary | ICD-10-CM | POA: Diagnosis not present

## 2013-09-11 DIAGNOSIS — M6281 Muscle weakness (generalized): Secondary | ICD-10-CM | POA: Diagnosis not present

## 2013-09-12 DIAGNOSIS — R1310 Dysphagia, unspecified: Secondary | ICD-10-CM | POA: Diagnosis not present

## 2013-09-12 DIAGNOSIS — K5150 Left sided colitis without complications: Secondary | ICD-10-CM | POA: Diagnosis not present

## 2013-09-12 DIAGNOSIS — K5902 Outlet dysfunction constipation: Secondary | ICD-10-CM | POA: Diagnosis not present

## 2013-09-13 DIAGNOSIS — M25559 Pain in unspecified hip: Secondary | ICD-10-CM | POA: Diagnosis not present

## 2013-09-13 DIAGNOSIS — M6281 Muscle weakness (generalized): Secondary | ICD-10-CM | POA: Diagnosis not present

## 2013-09-13 DIAGNOSIS — M545 Low back pain, unspecified: Secondary | ICD-10-CM | POA: Diagnosis not present

## 2013-09-16 DIAGNOSIS — M545 Low back pain, unspecified: Secondary | ICD-10-CM | POA: Diagnosis not present

## 2013-09-16 DIAGNOSIS — M6281 Muscle weakness (generalized): Secondary | ICD-10-CM | POA: Diagnosis not present

## 2013-09-16 DIAGNOSIS — M25559 Pain in unspecified hip: Secondary | ICD-10-CM | POA: Diagnosis not present

## 2013-09-18 DIAGNOSIS — M545 Low back pain, unspecified: Secondary | ICD-10-CM | POA: Diagnosis not present

## 2013-09-18 DIAGNOSIS — M6281 Muscle weakness (generalized): Secondary | ICD-10-CM | POA: Diagnosis not present

## 2013-09-18 DIAGNOSIS — M25559 Pain in unspecified hip: Secondary | ICD-10-CM | POA: Diagnosis not present

## 2013-09-20 DIAGNOSIS — M25559 Pain in unspecified hip: Secondary | ICD-10-CM | POA: Diagnosis not present

## 2013-09-20 DIAGNOSIS — M545 Low back pain, unspecified: Secondary | ICD-10-CM | POA: Diagnosis not present

## 2013-09-20 DIAGNOSIS — M6281 Muscle weakness (generalized): Secondary | ICD-10-CM | POA: Diagnosis not present

## 2013-09-26 DIAGNOSIS — Z1231 Encounter for screening mammogram for malignant neoplasm of breast: Secondary | ICD-10-CM | POA: Diagnosis not present

## 2013-09-30 DIAGNOSIS — M6281 Muscle weakness (generalized): Secondary | ICD-10-CM | POA: Diagnosis not present

## 2013-09-30 DIAGNOSIS — M545 Low back pain, unspecified: Secondary | ICD-10-CM | POA: Diagnosis not present

## 2013-09-30 DIAGNOSIS — M25559 Pain in unspecified hip: Secondary | ICD-10-CM | POA: Diagnosis not present

## 2013-10-03 DIAGNOSIS — M25559 Pain in unspecified hip: Secondary | ICD-10-CM | POA: Diagnosis not present

## 2013-10-03 DIAGNOSIS — M545 Low back pain, unspecified: Secondary | ICD-10-CM | POA: Diagnosis not present

## 2013-10-03 DIAGNOSIS — M6281 Muscle weakness (generalized): Secondary | ICD-10-CM | POA: Diagnosis not present

## 2013-10-04 ENCOUNTER — Other Ambulatory Visit: Payer: Self-pay | Admitting: Internal Medicine

## 2013-10-04 DIAGNOSIS — M545 Low back pain, unspecified: Secondary | ICD-10-CM

## 2013-10-04 DIAGNOSIS — M25551 Pain in right hip: Secondary | ICD-10-CM

## 2013-10-14 ENCOUNTER — Other Ambulatory Visit: Payer: Medicare Other

## 2013-10-14 DIAGNOSIS — M545 Low back pain, unspecified: Secondary | ICD-10-CM | POA: Diagnosis not present

## 2013-10-14 DIAGNOSIS — Z6828 Body mass index (BMI) 28.0-28.9, adult: Secondary | ICD-10-CM | POA: Diagnosis not present

## 2013-10-14 DIAGNOSIS — Z1331 Encounter for screening for depression: Secondary | ICD-10-CM | POA: Diagnosis not present

## 2013-10-15 ENCOUNTER — Other Ambulatory Visit: Payer: Medicare Other

## 2014-01-29 DIAGNOSIS — Z23 Encounter for immunization: Secondary | ICD-10-CM | POA: Diagnosis not present

## 2014-05-07 DIAGNOSIS — K5902 Outlet dysfunction constipation: Secondary | ICD-10-CM | POA: Diagnosis not present

## 2014-06-18 DIAGNOSIS — K648 Other hemorrhoids: Secondary | ICD-10-CM | POA: Diagnosis not present

## 2014-07-14 DIAGNOSIS — Z0000 Encounter for general adult medical examination without abnormal findings: Secondary | ICD-10-CM | POA: Diagnosis not present

## 2014-07-14 DIAGNOSIS — R030 Elevated blood-pressure reading, without diagnosis of hypertension: Secondary | ICD-10-CM | POA: Diagnosis not present

## 2014-07-14 DIAGNOSIS — E785 Hyperlipidemia, unspecified: Secondary | ICD-10-CM | POA: Diagnosis not present

## 2014-07-14 DIAGNOSIS — M810 Age-related osteoporosis without current pathological fracture: Secondary | ICD-10-CM | POA: Diagnosis not present

## 2014-07-21 DIAGNOSIS — K5900 Constipation, unspecified: Secondary | ICD-10-CM | POA: Diagnosis not present

## 2014-07-21 DIAGNOSIS — Z6828 Body mass index (BMI) 28.0-28.9, adult: Secondary | ICD-10-CM | POA: Diagnosis not present

## 2014-07-21 DIAGNOSIS — Z1389 Encounter for screening for other disorder: Secondary | ICD-10-CM | POA: Diagnosis not present

## 2014-07-21 DIAGNOSIS — Z1231 Encounter for screening mammogram for malignant neoplasm of breast: Secondary | ICD-10-CM | POA: Diagnosis not present

## 2014-07-21 DIAGNOSIS — M545 Low back pain: Secondary | ICD-10-CM | POA: Diagnosis not present

## 2014-07-21 DIAGNOSIS — Z0000 Encounter for general adult medical examination without abnormal findings: Secondary | ICD-10-CM | POA: Diagnosis not present

## 2014-07-21 DIAGNOSIS — M25551 Pain in right hip: Secondary | ICD-10-CM | POA: Diagnosis not present

## 2014-07-21 DIAGNOSIS — M1990 Unspecified osteoarthritis, unspecified site: Secondary | ICD-10-CM | POA: Diagnosis not present

## 2014-07-21 DIAGNOSIS — M5380 Other specified dorsopathies, site unspecified: Secondary | ICD-10-CM | POA: Diagnosis not present

## 2014-07-21 DIAGNOSIS — R030 Elevated blood-pressure reading, without diagnosis of hypertension: Secondary | ICD-10-CM | POA: Diagnosis not present

## 2014-07-21 DIAGNOSIS — M546 Pain in thoracic spine: Secondary | ICD-10-CM | POA: Diagnosis not present

## 2014-07-21 DIAGNOSIS — E785 Hyperlipidemia, unspecified: Secondary | ICD-10-CM | POA: Diagnosis not present

## 2014-07-24 DIAGNOSIS — Z1212 Encounter for screening for malignant neoplasm of rectum: Secondary | ICD-10-CM | POA: Diagnosis not present

## 2014-07-30 DIAGNOSIS — H524 Presbyopia: Secondary | ICD-10-CM | POA: Diagnosis not present

## 2014-07-30 DIAGNOSIS — H3531 Nonexudative age-related macular degeneration: Secondary | ICD-10-CM | POA: Diagnosis not present

## 2014-10-07 DIAGNOSIS — Z1231 Encounter for screening mammogram for malignant neoplasm of breast: Secondary | ICD-10-CM | POA: Diagnosis not present

## 2015-01-31 DIAGNOSIS — Z23 Encounter for immunization: Secondary | ICD-10-CM | POA: Diagnosis not present

## 2015-02-05 DIAGNOSIS — H04123 Dry eye syndrome of bilateral lacrimal glands: Secondary | ICD-10-CM | POA: Diagnosis not present

## 2015-02-05 DIAGNOSIS — H353110 Nonexudative age-related macular degeneration, right eye, stage unspecified: Secondary | ICD-10-CM | POA: Diagnosis not present

## 2015-04-14 DIAGNOSIS — K5902 Outlet dysfunction constipation: Secondary | ICD-10-CM | POA: Diagnosis not present

## 2015-04-23 DIAGNOSIS — R0982 Postnasal drip: Secondary | ICD-10-CM | POA: Diagnosis not present

## 2015-04-23 DIAGNOSIS — S0992XA Unspecified injury of nose, initial encounter: Secondary | ICD-10-CM | POA: Diagnosis not present

## 2015-04-28 DIAGNOSIS — Z6827 Body mass index (BMI) 27.0-27.9, adult: Secondary | ICD-10-CM | POA: Diagnosis not present

## 2015-04-28 DIAGNOSIS — K921 Melena: Secondary | ICD-10-CM | POA: Diagnosis not present

## 2015-04-28 DIAGNOSIS — J069 Acute upper respiratory infection, unspecified: Secondary | ICD-10-CM | POA: Diagnosis not present

## 2015-04-28 DIAGNOSIS — R05 Cough: Secondary | ICD-10-CM | POA: Diagnosis not present

## 2015-04-28 DIAGNOSIS — K649 Unspecified hemorrhoids: Secondary | ICD-10-CM | POA: Diagnosis not present

## 2015-04-29 DIAGNOSIS — Z79899 Other long term (current) drug therapy: Secondary | ICD-10-CM | POA: Diagnosis not present

## 2015-04-29 DIAGNOSIS — D7589 Other specified diseases of blood and blood-forming organs: Secondary | ICD-10-CM | POA: Diagnosis not present

## 2015-05-27 DIAGNOSIS — M5136 Other intervertebral disc degeneration, lumbar region: Secondary | ICD-10-CM | POA: Diagnosis not present

## 2015-05-29 DIAGNOSIS — R2681 Unsteadiness on feet: Secondary | ICD-10-CM | POA: Diagnosis not present

## 2015-05-29 DIAGNOSIS — M5186 Other intervertebral disc disorders, lumbar region: Secondary | ICD-10-CM | POA: Diagnosis not present

## 2015-05-29 DIAGNOSIS — R41841 Cognitive communication deficit: Secondary | ICD-10-CM | POA: Diagnosis not present

## 2015-05-29 DIAGNOSIS — M545 Low back pain: Secondary | ICD-10-CM | POA: Diagnosis not present

## 2015-05-29 DIAGNOSIS — K649 Unspecified hemorrhoids: Secondary | ICD-10-CM | POA: Diagnosis not present

## 2015-06-03 DIAGNOSIS — M545 Low back pain: Secondary | ICD-10-CM | POA: Diagnosis not present

## 2015-06-03 DIAGNOSIS — M6281 Muscle weakness (generalized): Secondary | ICD-10-CM | POA: Diagnosis not present

## 2015-06-03 DIAGNOSIS — K649 Unspecified hemorrhoids: Secondary | ICD-10-CM | POA: Diagnosis not present

## 2015-06-03 DIAGNOSIS — R2681 Unsteadiness on feet: Secondary | ICD-10-CM | POA: Diagnosis not present

## 2015-06-03 DIAGNOSIS — M5186 Other intervertebral disc disorders, lumbar region: Secondary | ICD-10-CM | POA: Diagnosis not present

## 2015-06-05 DIAGNOSIS — R2681 Unsteadiness on feet: Secondary | ICD-10-CM | POA: Diagnosis not present

## 2015-06-05 DIAGNOSIS — K649 Unspecified hemorrhoids: Secondary | ICD-10-CM | POA: Diagnosis not present

## 2015-06-05 DIAGNOSIS — M5186 Other intervertebral disc disorders, lumbar region: Secondary | ICD-10-CM | POA: Diagnosis not present

## 2015-06-05 DIAGNOSIS — M545 Low back pain: Secondary | ICD-10-CM | POA: Diagnosis not present

## 2015-06-05 DIAGNOSIS — M6281 Muscle weakness (generalized): Secondary | ICD-10-CM | POA: Diagnosis not present

## 2015-06-09 DIAGNOSIS — K649 Unspecified hemorrhoids: Secondary | ICD-10-CM | POA: Diagnosis not present

## 2015-06-09 DIAGNOSIS — M6281 Muscle weakness (generalized): Secondary | ICD-10-CM | POA: Diagnosis not present

## 2015-06-09 DIAGNOSIS — R2681 Unsteadiness on feet: Secondary | ICD-10-CM | POA: Diagnosis not present

## 2015-06-09 DIAGNOSIS — M5186 Other intervertebral disc disorders, lumbar region: Secondary | ICD-10-CM | POA: Diagnosis not present

## 2015-06-09 DIAGNOSIS — M545 Low back pain: Secondary | ICD-10-CM | POA: Diagnosis not present

## 2015-06-15 DIAGNOSIS — K649 Unspecified hemorrhoids: Secondary | ICD-10-CM | POA: Diagnosis not present

## 2015-06-15 DIAGNOSIS — K921 Melena: Secondary | ICD-10-CM | POA: Diagnosis not present

## 2015-06-15 DIAGNOSIS — R05 Cough: Secondary | ICD-10-CM | POA: Diagnosis not present

## 2015-06-15 DIAGNOSIS — Z6827 Body mass index (BMI) 27.0-27.9, adult: Secondary | ICD-10-CM | POA: Diagnosis not present

## 2015-06-16 ENCOUNTER — Other Ambulatory Visit: Payer: Self-pay | Admitting: Internal Medicine

## 2015-06-16 ENCOUNTER — Encounter (HOSPITAL_COMMUNITY): Payer: Self-pay

## 2015-06-16 DIAGNOSIS — R7989 Other specified abnormal findings of blood chemistry: Secondary | ICD-10-CM

## 2015-06-16 DIAGNOSIS — R109 Unspecified abdominal pain: Secondary | ICD-10-CM | POA: Diagnosis not present

## 2015-06-16 DIAGNOSIS — R945 Abnormal results of liver function studies: Secondary | ICD-10-CM

## 2015-06-16 LAB — COMPREHENSIVE METABOLIC PANEL
ALBUMIN: 3.7 g/dL (ref 3.5–5.0)
ALT: 68 U/L — AB (ref 14–54)
AST: 62 U/L — AB (ref 15–41)
Alkaline Phosphatase: 57 U/L (ref 38–126)
Anion gap: 12 (ref 5–15)
BUN: 8 mg/dL (ref 6–20)
CHLORIDE: 88 mmol/L — AB (ref 101–111)
CO2: 23 mmol/L (ref 22–32)
CREATININE: 0.53 mg/dL (ref 0.44–1.00)
Calcium: 8.8 mg/dL — ABNORMAL LOW (ref 8.9–10.3)
GFR calc Af Amer: >60 mL/min (ref >60)
GFR calc non Af Amer: >60 mL/min (ref >60)
GLUCOSE: 140 mg/dL — AB (ref 65–99)
Potassium: 3.9 mmol/L (ref 3.5–5.1)
SODIUM: 123 mmol/L — AB (ref 135–145)
Total Bilirubin: 1.4 mg/dL — ABNORMAL HIGH (ref 0.3–1.2)
Total Protein: 6.5 g/dL (ref 6.5–8.1)

## 2015-06-16 LAB — LIPASE, BLOOD: LIPASE: 21 U/L (ref 11–51)

## 2015-06-16 LAB — CBC
HCT: 40.2 % (ref 36.0–46.0)
Hemoglobin: 13.9 g/dL (ref 12.0–15.0)
MCH: 32.3 pg (ref 26.0–34.0)
MCHC: 34.6 g/dL (ref 30.0–36.0)
MCV: 93.3 fL (ref 78.0–100.0)
PLATELETS: 177 10*3/uL (ref 150–400)
RBC: 4.31 MIL/uL (ref 3.87–5.11)
RDW: 12.3 % (ref 11.5–15.5)
WBC: 6.2 10*3/uL (ref 4.0–10.5)

## 2015-06-16 NOTE — ED Notes (Signed)
Per EMS, Pt, from home, c/o bilateral flank pain radiating into bilateral abdomen x 1 week.  Pain score 7/10.  Denies n/v/d.  Denies GU complaints.  Pt was diagnosed w/ internal hemorrhoids x 1 week ago.

## 2015-06-17 ENCOUNTER — Emergency Department (HOSPITAL_COMMUNITY): Payer: Medicare Other

## 2015-06-17 ENCOUNTER — Emergency Department (HOSPITAL_COMMUNITY)
Admission: EM | Admit: 2015-06-17 | Discharge: 2015-06-17 | Disposition: A | Discharge status: Home / Self Care | Payer: Medicare Other | Attending: Emergency Medicine | Admitting: Emergency Medicine

## 2015-06-17 ENCOUNTER — Emergency Department (HOSPITAL_COMMUNITY)
Admission: EM | Admit: 2015-06-17 | Discharge: 2015-06-17 | Disposition: A | Discharge status: Home / Self Care | Payer: Medicare Other | Source: Home / Self Care

## 2015-06-17 ENCOUNTER — Ambulatory Visit (HOSPITAL_COMMUNITY): Admission: RE | Admit: 2015-06-17 | Payer: Medicare Other | Source: Ambulatory Visit

## 2015-06-17 ENCOUNTER — Encounter (HOSPITAL_COMMUNITY): Payer: Self-pay | Admitting: Vascular Surgery

## 2015-06-17 ENCOUNTER — Ambulatory Visit
Admission: RE | Admit: 2015-06-17 | Discharge: 2015-06-17 | Disposition: A | Discharge status: Home / Self Care | Payer: Medicare Other | Source: Ambulatory Visit | Attending: Internal Medicine | Admitting: Internal Medicine

## 2015-06-17 DIAGNOSIS — R7989 Other specified abnormal findings of blood chemistry: Secondary | ICD-10-CM

## 2015-06-17 DIAGNOSIS — M545 Low back pain: Secondary | ICD-10-CM | POA: Diagnosis not present

## 2015-06-17 DIAGNOSIS — M549 Dorsalgia, unspecified: Secondary | ICD-10-CM | POA: Insufficient documentation

## 2015-06-17 DIAGNOSIS — R531 Weakness: Secondary | ICD-10-CM | POA: Insufficient documentation

## 2015-06-17 DIAGNOSIS — R945 Abnormal results of liver function studies: Secondary | ICD-10-CM | POA: Diagnosis not present

## 2015-06-17 DIAGNOSIS — R1032 Left lower quadrant pain: Secondary | ICD-10-CM

## 2015-06-17 DIAGNOSIS — R109 Unspecified abdominal pain: Secondary | ICD-10-CM | POA: Diagnosis not present

## 2015-06-17 DIAGNOSIS — R404 Transient alteration of awareness: Secondary | ICD-10-CM | POA: Diagnosis not present

## 2015-06-17 HISTORY — DX: Unspecified hemorrhoids: K64.9

## 2015-06-17 LAB — URINALYSIS, ROUTINE W REFLEX MICROSCOPIC
Glucose, UA: NEGATIVE mg/dL
HGB URINE DIPSTICK: NEGATIVE
Ketones, ur: >80 mg/dL — AB
Nitrite: NEGATIVE
PH: 6 (ref 5.0–8.0)
Protein, ur: 30 mg/dL — AB
SPECIFIC GRAVITY, URINE: 1.026 (ref 1.005–1.030)

## 2015-06-17 LAB — URINE MICROSCOPIC-ADD ON: RBC / HPF: NONE SEEN RBC/hpf (ref 0–5)

## 2015-06-17 LAB — COMPREHENSIVE METABOLIC PANEL
ALBUMIN: 3.5 g/dL (ref 3.5–5.0)
ALK PHOS: 61 U/L (ref 38–126)
ALT: 81 U/L — ABNORMAL HIGH (ref 14–54)
AST: 66 U/L — AB (ref 15–41)
Anion gap: 13 (ref 5–15)
BILIRUBIN TOTAL: 1.3 mg/dL — AB (ref 0.3–1.2)
BUN: 10 mg/dL (ref 6–20)
CALCIUM: 8.9 mg/dL (ref 8.9–10.3)
CO2: 23 mmol/L (ref 22–32)
CREATININE: 0.48 mg/dL (ref 0.44–1.00)
Chloride: 84 mmol/L — ABNORMAL LOW (ref 101–111)
GFR calc Af Amer: >60 mL/min (ref >60)
Glucose, Bld: 129 mg/dL — ABNORMAL HIGH (ref 65–99)
Potassium: 4.8 mmol/L (ref 3.5–5.1)
Sodium: 120 mmol/L — ABNORMAL LOW (ref 135–145)
TOTAL PROTEIN: 6.1 g/dL — AB (ref 6.5–8.1)

## 2015-06-17 LAB — CBC
HCT: 40.7 % (ref 36.0–46.0)
Hemoglobin: 14.5 g/dL (ref 12.0–15.0)
MCH: 32.4 pg (ref 26.0–34.0)
MCHC: 35.6 g/dL (ref 30.0–36.0)
MCV: 91.1 fL (ref 78.0–100.0)
PLATELETS: 176 10*3/uL (ref 150–400)
RBC: 4.47 MIL/uL (ref 3.87–5.11)
RDW: 12.2 % (ref 11.5–15.5)
WBC: 5.9 10*3/uL (ref 4.0–10.5)

## 2015-06-17 LAB — LIPASE, BLOOD: LIPASE: 23 U/L (ref 11–51)

## 2015-06-17 MED ORDER — SODIUM CHLORIDE 0.9 % IV BOLUS (SEPSIS): 2000.0000 mL | Freq: Once | INTRAVENOUS | Status: DC

## 2015-06-17 MED ORDER — ONDANSETRON 4 MG PO TBDP
ORAL_TABLET | ORAL | Status: AC
  Filled 2015-06-17: qty 1

## 2015-06-17 MED ORDER — ONDANSETRON 4 MG PO TBDP: 4.0000 mg | ORAL_TABLET | Freq: Once | ORAL | Status: DC | PRN

## 2015-06-17 MED ORDER — IOHEXOL 300 MG/ML  SOLN: 50.0000 mL | Freq: Once | INTRAMUSCULAR | Status: DC | PRN

## 2015-06-17 NOTE — ED Notes (Signed)
Pt reports to the ED for eval of back pain that radiates into her abdomen. She also reports left lower quadrant cramping. Denies any urinary symptoms. She is a resident of friend's home. She was treated for a virus but she has not improved. She also has some generalized weakness. Pt denies any N/V/D. Pt A&OX4 , resp e/u, and skin warm and dry.

## 2015-06-17 NOTE — ED Notes (Signed)
The pt has decided to leave  They are tired fo waiting

## 2015-06-18 DIAGNOSIS — E871 Hypo-osmolality and hyponatremia: Secondary | ICD-10-CM | POA: Diagnosis not present

## 2015-06-18 DIAGNOSIS — I2510 Atherosclerotic heart disease of native coronary artery without angina pectoris: Secondary | ICD-10-CM | POA: Diagnosis present

## 2015-06-18 DIAGNOSIS — E222 Syndrome of inappropriate secretion of antidiuretic hormone: Secondary | ICD-10-CM | POA: Diagnosis present

## 2015-06-18 DIAGNOSIS — I472 Ventricular tachycardia: Secondary | ICD-10-CM | POA: Diagnosis not present

## 2015-06-18 DIAGNOSIS — I083 Combined rheumatic disorders of mitral, aortic and tricuspid valves: Secondary | ICD-10-CM | POA: Diagnosis not present

## 2015-06-18 DIAGNOSIS — M5186 Other intervertebral disc disorders, lumbar region: Secondary | ICD-10-CM | POA: Diagnosis not present

## 2015-06-18 DIAGNOSIS — M6281 Muscle weakness (generalized): Secondary | ICD-10-CM | POA: Diagnosis not present

## 2015-06-18 DIAGNOSIS — I499 Cardiac arrhythmia, unspecified: Secondary | ICD-10-CM | POA: Diagnosis not present

## 2015-06-18 DIAGNOSIS — R410 Disorientation, unspecified: Secondary | ICD-10-CM | POA: Diagnosis not present

## 2015-06-18 DIAGNOSIS — J156 Pneumonia due to other aerobic Gram-negative bacteria: Secondary | ICD-10-CM | POA: Diagnosis not present

## 2015-06-18 DIAGNOSIS — R2681 Unsteadiness on feet: Secondary | ICD-10-CM | POA: Diagnosis not present

## 2015-06-18 DIAGNOSIS — R29898 Other symptoms and signs involving the musculoskeletal system: Secondary | ICD-10-CM | POA: Diagnosis not present

## 2015-06-18 DIAGNOSIS — I429 Cardiomyopathy, unspecified: Secondary | ICD-10-CM | POA: Diagnosis not present

## 2015-06-18 DIAGNOSIS — I5023 Acute on chronic systolic (congestive) heart failure: Secondary | ICD-10-CM | POA: Diagnosis present

## 2015-06-18 DIAGNOSIS — J90 Pleural effusion, not elsewhere classified: Secondary | ICD-10-CM | POA: Diagnosis not present

## 2015-06-18 DIAGNOSIS — I5189 Other ill-defined heart diseases: Secondary | ICD-10-CM | POA: Diagnosis not present

## 2015-06-18 DIAGNOSIS — J984 Other disorders of lung: Secondary | ICD-10-CM | POA: Diagnosis not present

## 2015-06-18 DIAGNOSIS — I519 Heart disease, unspecified: Secondary | ICD-10-CM | POA: Diagnosis not present

## 2015-06-18 DIAGNOSIS — I447 Left bundle-branch block, unspecified: Secondary | ICD-10-CM | POA: Diagnosis not present

## 2015-06-18 DIAGNOSIS — J159 Unspecified bacterial pneumonia: Secondary | ICD-10-CM | POA: Diagnosis not present

## 2015-06-18 DIAGNOSIS — R188 Other ascites: Secondary | ICD-10-CM | POA: Diagnosis not present

## 2015-06-18 DIAGNOSIS — J168 Pneumonia due to other specified infectious organisms: Secondary | ICD-10-CM | POA: Diagnosis not present

## 2015-06-18 DIAGNOSIS — Z79899 Other long term (current) drug therapy: Secondary | ICD-10-CM | POA: Diagnosis not present

## 2015-06-18 DIAGNOSIS — R0600 Dyspnea, unspecified: Secondary | ICD-10-CM | POA: Diagnosis not present

## 2015-06-18 DIAGNOSIS — Z87891 Personal history of nicotine dependence: Secondary | ICD-10-CM | POA: Diagnosis not present

## 2015-06-18 DIAGNOSIS — R531 Weakness: Secondary | ICD-10-CM | POA: Diagnosis present

## 2015-06-18 DIAGNOSIS — E876 Hypokalemia: Secondary | ICD-10-CM | POA: Diagnosis not present

## 2015-06-18 DIAGNOSIS — K589 Irritable bowel syndrome without diarrhea: Secondary | ICD-10-CM | POA: Diagnosis present

## 2015-06-18 DIAGNOSIS — K649 Unspecified hemorrhoids: Secondary | ICD-10-CM | POA: Diagnosis not present

## 2015-06-18 DIAGNOSIS — R000 Tachycardia, unspecified: Secondary | ICD-10-CM | POA: Diagnosis not present

## 2015-06-18 DIAGNOSIS — K5730 Diverticulosis of large intestine without perforation or abscess without bleeding: Secondary | ICD-10-CM | POA: Diagnosis not present

## 2015-06-18 DIAGNOSIS — R41841 Cognitive communication deficit: Secondary | ICD-10-CM | POA: Diagnosis not present

## 2015-06-18 DIAGNOSIS — R918 Other nonspecific abnormal finding of lung field: Secondary | ICD-10-CM | POA: Diagnosis not present

## 2015-06-18 DIAGNOSIS — I481 Persistent atrial fibrillation: Secondary | ICD-10-CM | POA: Diagnosis not present

## 2015-06-18 DIAGNOSIS — R0989 Other specified symptoms and signs involving the circulatory and respiratory systems: Secondary | ICD-10-CM | POA: Diagnosis not present

## 2015-06-18 DIAGNOSIS — Z452 Encounter for adjustment and management of vascular access device: Secondary | ICD-10-CM | POA: Diagnosis not present

## 2015-06-18 DIAGNOSIS — R1030 Lower abdominal pain, unspecified: Secondary | ICD-10-CM | POA: Diagnosis not present

## 2015-06-18 DIAGNOSIS — J189 Pneumonia, unspecified organism: Secondary | ICD-10-CM | POA: Diagnosis not present

## 2015-06-18 DIAGNOSIS — M545 Low back pain: Secondary | ICD-10-CM | POA: Diagnosis not present

## 2015-06-18 DIAGNOSIS — J449 Chronic obstructive pulmonary disease, unspecified: Secondary | ICD-10-CM | POA: Diagnosis not present

## 2015-06-18 DIAGNOSIS — I517 Cardiomegaly: Secondary | ICD-10-CM | POA: Diagnosis not present

## 2015-06-18 DIAGNOSIS — Z880 Allergy status to penicillin: Secondary | ICD-10-CM | POA: Diagnosis not present

## 2015-06-20 DIAGNOSIS — R1030 Lower abdominal pain, unspecified: Secondary | ICD-10-CM

## 2015-06-20 HISTORY — DX: Lower abdominal pain, unspecified: R10.30

## 2015-06-24 DIAGNOSIS — J159 Unspecified bacterial pneumonia: Secondary | ICD-10-CM | POA: Diagnosis not present

## 2015-06-24 DIAGNOSIS — I429 Cardiomyopathy, unspecified: Secondary | ICD-10-CM | POA: Diagnosis not present

## 2015-06-24 DIAGNOSIS — I5023 Acute on chronic systolic (congestive) heart failure: Secondary | ICD-10-CM | POA: Diagnosis not present

## 2015-06-24 DIAGNOSIS — K649 Unspecified hemorrhoids: Secondary | ICD-10-CM | POA: Diagnosis not present

## 2015-06-24 DIAGNOSIS — R2681 Unsteadiness on feet: Secondary | ICD-10-CM | POA: Diagnosis not present

## 2015-06-24 DIAGNOSIS — R29898 Other symptoms and signs involving the musculoskeletal system: Secondary | ICD-10-CM | POA: Diagnosis not present

## 2015-06-24 DIAGNOSIS — J189 Pneumonia, unspecified organism: Secondary | ICD-10-CM | POA: Diagnosis not present

## 2015-06-24 DIAGNOSIS — R5383 Other fatigue: Secondary | ICD-10-CM | POA: Diagnosis not present

## 2015-06-24 DIAGNOSIS — R5381 Other malaise: Secondary | ICD-10-CM | POA: Diagnosis not present

## 2015-06-24 DIAGNOSIS — E876 Hypokalemia: Secondary | ICD-10-CM | POA: Diagnosis not present

## 2015-06-24 DIAGNOSIS — I5020 Unspecified systolic (congestive) heart failure: Secondary | ICD-10-CM | POA: Diagnosis not present

## 2015-06-24 DIAGNOSIS — J90 Pleural effusion, not elsewhere classified: Secondary | ICD-10-CM | POA: Diagnosis not present

## 2015-06-24 DIAGNOSIS — I2510 Atherosclerotic heart disease of native coronary artery without angina pectoris: Secondary | ICD-10-CM | POA: Diagnosis not present

## 2015-06-24 DIAGNOSIS — R600 Localized edema: Secondary | ICD-10-CM | POA: Diagnosis not present

## 2015-06-24 DIAGNOSIS — M6281 Muscle weakness (generalized): Secondary | ICD-10-CM | POA: Diagnosis not present

## 2015-06-24 DIAGNOSIS — I447 Left bundle-branch block, unspecified: Secondary | ICD-10-CM | POA: Diagnosis not present

## 2015-06-24 DIAGNOSIS — M5186 Other intervertebral disc disorders, lumbar region: Secondary | ICD-10-CM | POA: Diagnosis not present

## 2015-06-24 DIAGNOSIS — I499 Cardiac arrhythmia, unspecified: Secondary | ICD-10-CM | POA: Diagnosis not present

## 2015-06-24 DIAGNOSIS — M545 Low back pain: Secondary | ICD-10-CM | POA: Diagnosis not present

## 2015-06-24 DIAGNOSIS — I472 Ventricular tachycardia: Secondary | ICD-10-CM | POA: Diagnosis not present

## 2015-06-24 DIAGNOSIS — R531 Weakness: Secondary | ICD-10-CM | POA: Diagnosis not present

## 2015-06-24 DIAGNOSIS — R0600 Dyspnea, unspecified: Secondary | ICD-10-CM | POA: Diagnosis not present

## 2015-06-24 DIAGNOSIS — E871 Hypo-osmolality and hyponatremia: Secondary | ICD-10-CM | POA: Diagnosis not present

## 2015-06-24 DIAGNOSIS — I25810 Atherosclerosis of coronary artery bypass graft(s) without angina pectoris: Secondary | ICD-10-CM | POA: Diagnosis not present

## 2015-06-24 DIAGNOSIS — K589 Irritable bowel syndrome without diarrhea: Secondary | ICD-10-CM | POA: Diagnosis not present

## 2015-06-24 DIAGNOSIS — R41841 Cognitive communication deficit: Secondary | ICD-10-CM | POA: Diagnosis not present

## 2015-06-24 DIAGNOSIS — I481 Persistent atrial fibrillation: Secondary | ICD-10-CM | POA: Diagnosis not present

## 2015-06-24 DIAGNOSIS — I4891 Unspecified atrial fibrillation: Secondary | ICD-10-CM | POA: Diagnosis not present

## 2015-06-24 DIAGNOSIS — R000 Tachycardia, unspecified: Secondary | ICD-10-CM | POA: Diagnosis not present

## 2015-06-24 DIAGNOSIS — I480 Paroxysmal atrial fibrillation: Secondary | ICD-10-CM | POA: Diagnosis not present

## 2015-06-25 ENCOUNTER — Non-Acute Institutional Stay (SKILLED_NURSING_FACILITY): Payer: Medicare Other | Admitting: Internal Medicine

## 2015-06-25 DIAGNOSIS — E871 Hypo-osmolality and hyponatremia: Secondary | ICD-10-CM | POA: Diagnosis not present

## 2015-06-25 DIAGNOSIS — R531 Weakness: Secondary | ICD-10-CM | POA: Diagnosis not present

## 2015-06-25 DIAGNOSIS — K589 Irritable bowel syndrome without diarrhea: Secondary | ICD-10-CM

## 2015-06-25 DIAGNOSIS — I5023 Acute on chronic systolic (congestive) heart failure: Secondary | ICD-10-CM | POA: Diagnosis not present

## 2015-06-25 DIAGNOSIS — R600 Localized edema: Secondary | ICD-10-CM

## 2015-06-25 DIAGNOSIS — I4729 Other ventricular tachycardia: Secondary | ICD-10-CM

## 2015-06-25 DIAGNOSIS — I447 Left bundle-branch block, unspecified: Secondary | ICD-10-CM | POA: Diagnosis not present

## 2015-06-25 DIAGNOSIS — R5381 Other malaise: Secondary | ICD-10-CM

## 2015-06-25 DIAGNOSIS — I472 Ventricular tachycardia: Secondary | ICD-10-CM

## 2015-06-25 DIAGNOSIS — I4891 Unspecified atrial fibrillation: Secondary | ICD-10-CM

## 2015-06-25 DIAGNOSIS — J189 Pneumonia, unspecified organism: Secondary | ICD-10-CM

## 2015-06-25 DIAGNOSIS — I25810 Atherosclerosis of coronary artery bypass graft(s) without angina pectoris: Secondary | ICD-10-CM

## 2015-06-25 DIAGNOSIS — I5020 Unspecified systolic (congestive) heart failure: Secondary | ICD-10-CM

## 2015-06-25 DIAGNOSIS — R0600 Dyspnea, unspecified: Secondary | ICD-10-CM | POA: Diagnosis not present

## 2015-06-25 HISTORY — DX: Pneumonia, unspecified organism: J18.9

## 2015-06-25 HISTORY — DX: Weakness: R53.1

## 2015-06-25 HISTORY — DX: Localized edema: R60.0

## 2015-06-25 HISTORY — DX: Dyspnea, unspecified: R06.00

## 2015-06-25 HISTORY — DX: Unspecified systolic (congestive) heart failure: I50.20

## 2015-06-25 HISTORY — DX: Hypo-osmolality and hyponatremia: E87.1

## 2015-06-25 HISTORY — DX: Irritable bowel syndrome, unspecified: K58.9

## 2015-06-25 HISTORY — DX: Other malaise: R53.81

## 2015-06-25 HISTORY — DX: Ventricular tachycardia: I47.2

## 2015-06-25 HISTORY — DX: Atherosclerosis of coronary artery bypass graft(s) without angina pectoris: I25.810

## 2015-06-25 HISTORY — DX: Other ventricular tachycardia: I47.29

## 2015-06-25 NOTE — Progress Notes (Signed)
Patient ID: Darlene Collins, female   DOB: 07-21-1928, 82 y.o.   MRN: [id number]   History and Physical  Provider:  Dr. Jeanmarie Hubert Location:  [place] Room Number: 18 Place of Service:  SNF (35)  PCP: Jerlyn Ly, MD Patient Care Team: Crist Infante, MD as PCP - General (Internal Medicine)  Extended Emergency Contact Information Primary Emergency Contact: Radney,Robert Address: [place] [address] [place] Phone: [phone] Relation: Spouse  Code Status: DNR Goals of Care: Advanced Directive information Advanced Directives 06/25/2015  Does patient have an advance directive? Yes  Type of Advance Directive Out of facility DNR (pink MOST or yellow form);Living will  Does patient want to make changes to advanced directive? -  Copy of advanced directive(s) in chart? Yes      Chief Complaint  Patient presents with  . New Admit To SNF    New Admit to Skilled    HPI: Patient is a 82 y.o. female seen today for admission to [facility] SNF on 06/24/2015. Patient was hospitalized from 08/16/2015 through 06/24/2015 at [facility] in [place]. She had a pneumonia which was felt to be community-acquired. He was treated initially with Rocephin and Zithromax. She was switched to [facility] orally. During her hospitalization she also had hyponatremia to low 117 mEq per liter. This was felt due to SIADH.  Patient has cardiomegaly with valvular disease. 2-D echocardiogram done during the hospitalization showed left ventricular ejection fraction at 35-40%. There was mitral regurgitation tricuspid regurgitation and aortic insufficiency.  Patient has known paroxysmal atrial fibrillation and is chronically anticoagulated with apixaban. She was to be seen in [facility] cardiology on 07/10/2015.  Other reported problems include left bundle branch block, leg edema, systolic congestive heart failure, coronary artery disease, irritable  bowel syndrome, and debility from her recent illness.  Patient is improving quickly and we anticipate this will be a short-term rehabilitative stay with return to [place] cottage.  Past Medical History  Diagnosis Date  . High cholesterol   . Hemorrhoids   . Atrial fibrillation ([place])   . Systolic heart failure ([place])   . LBBB (left bundle branch block)   . Chronic anticoagulation   . Abdominal pain, lower 06/20/2015  . CAD (coronary artery disease) of artery bypass graft 06/25/2015  . Chronic cough 11/03/2011  . Debility 06/25/2015  . Dyspnea 06/25/2015  . Edema of both legs 06/25/2015    07/07/15 BNP 363.7   . Hypokalemia 07/01/2015    07/07/15 K 4.8   . Hyponatremia 06/25/2015    07/07/15 Na 134   . IBS (irritable bowel syndrome) 06/25/2015  . Nonsustained ventricular tachycardia ([place]) 06/25/2015  . Pneumonia 06/25/2015  . Systolic CHF ([place]) [id number]    07/07/15 BNP 363.7   . Weakness generalized 06/25/2015   Past Surgical History  Procedure Laterality Date  . Tonsillectomy and adenoidectomy    . Cataracts      reports that she quit smoking about 38 years ago. Her smoking use included Cigarettes. She has a 25 pack-year smoking history. She has never used smokeless tobacco. She reports that she drinks alcohol. She reports that she does not use illicit drugs. Social History   Social History  . Marital Status: Married    Spouse Name: N/A  . Number of Children: 2  . Years of Education: N/A   Occupational History  . Not on file.   Social History Main Topics  .  Smoking status: Former Smoker -- 1.00 packs/day for 25 years    Types: Cigarettes    Quit date: 10/30/1976  . Smokeless tobacco: Never Used  . Alcohol Use: Yes     Comment: wine  . Drug Use: No  . Sexual Activity: Not on file   Other Topics Concern  . Not on file   Social History Narrative     Family History  Problem Relation Age of Onset  . Heart attack Father     Health Maintenance  Topic Date Due  . TETANUS/TDAP   09/22/1949  . ZOSTAVAX  09/23/1990  . DEXA SCAN  09/23/1995  . PNA vac Low Risk Adult (1 of 2 - PCV13) 09/23/1995  . INFLUENZA VACCINE  12/01/2014    Allergies  Allergen Reactions  . Aspirin     Makes her feel unpleasant  . Sulfa Antibiotics       Medication List       This list is accurate as of: 06/25/15  3:12 PM.  Always use your most recent med list.               carvedilol 6.25 MG tablet  Commonly known as:  COREG  Take 6.25 mg by mouth 2 (two) times daily with a meal.     cefdinir 300 MG capsule  Commonly known as:  OMNICEF  Take 300 mg by mouth 2 (two) times daily. Stop date 06/26/15     CENTRUM SILVER PO  Take by mouth daily.     CRESTOR 5 MG tablet  Generic drug:  rosuvastatin  Take 5 mg by mouth. 3 times a week     ELIQUIS 5 MG Tabs tablet  Generic drug:  apixaban  Take 5 mg by mouth 2 (two) times daily.     EVISTA 60 MG tablet  Generic drug:  raloxifene  3 times a week     ezetimibe 10 MG tablet  Commonly known as:  ZETIA  Take 10 mg by mouth. 3 times a week     furosemide 40 MG tablet  Commonly known as:  LASIX  Take 40 mg by mouth daily.     naproxen sodium 220 MG tablet  Commonly known as:  ANAPROX  Take 220 mg by mouth as needed.     spironolactone 25 MG tablet  Commonly known as:  ALDACTONE  Take 25 mg by mouth daily.     Vitamin D (Ergocalciferol) 50000 units Caps capsule  Commonly known as:  DRISDOL  Once a week.        Review of Systems  Constitutional: Negative for fever, chills, diaphoresis, activity change, appetite change, fatigue and unexpected weight change.  HENT: Negative for congestion, ear discharge, ear pain, hearing loss, postnasal drip, rhinorrhea, sore throat, tinnitus, trouble swallowing and voice change.   Eyes: Negative for pain, redness, itching and visual disturbance.  Respiratory: Negative for cough, choking, shortness of breath and wheezing.        Pneumonia 06/18/2015.  Cardiovascular: Positive for leg  swelling (Edema at the ankles). Negative for chest pain and palpitations.       History of systolic congestive heart failure  Gastrointestinal: Negative for nausea, abdominal pain, diarrhea, constipation and abdominal distention.       History of irritable bowel syndrome.  Endocrine: Negative for cold intolerance, heat intolerance, polydipsia, polyphagia and polyuria.  Genitourinary: Negative for dysuria, urgency, frequency, hematuria, flank pain, vaginal discharge, difficulty urinating and pelvic pain.  Musculoskeletal: Positive for gait problem. Negative  for myalgias, back pain, arthralgias, neck pain and neck stiffness.       Generalized weakness.  Skin: Negative for color change, pallor and rash.  Allergic/Immunologic: Negative.   Neurological: Negative for dizziness, tremors, seizures, syncope, weakness, numbness and headaches.  Hematological: Negative for adenopathy. Does not bruise/bleed easily.  Psychiatric/Behavioral: Positive for decreased concentration. Negative for suicidal ideas, hallucinations, behavioral problems, confusion, sleep disturbance, dysphoric mood and agitation. The patient is not nervous/anxious and is not hyperactive.     Filed Vitals:   06/25/15 1500  BP: 122/80  Pulse: 74  Temp: 98.5 F (36.9 C)  TempSrc: Oral  Resp: 18  Height: 5\' 3"  (1.6 m)  Weight: 160 lb (72.576 kg)  SpO2: 97%   Body mass index is 28.35 kg/(m^2). Physical Exam  Constitutional: She is oriented to person, place, and time. She appears well-developed and well-nourished. No distress.  HENT:  Right Ear: External ear normal.  Left Ear: External ear normal.  Nose: Nose normal.  Mouth/Throat: Oropharynx is clear and moist. No oropharyngeal exudate.  Eyes: Conjunctivae and EOM are normal. Pupils are equal, round, and reactive to light. No scleral icterus.  Neck: No JVD present. No tracheal deviation present. No thyromegaly present.  Cardiovascular: Normal rate, regular rhythm, normal heart  sounds and intact distal pulses.  Exam reveals no gallop and no friction rub.   No murmur heard. Pulmonary/Chest: Effort normal. No respiratory distress. She has no wheezes. She has rales. She exhibits no tenderness.  Abdominal: She exhibits no distension and no mass. There is no tenderness.  Musculoskeletal: Normal range of motion. She exhibits edema (Trace). She exhibits no tenderness.  Lymphadenopathy:    She has no cervical adenopathy.  Neurological: She is alert and oriented to person, place, and time. No cranial nerve deficit. Coordination normal.  Skin: No rash noted. She is not diaphoretic. No erythema. No pallor.  Psychiatric: She has a normal mood and affect. Her behavior is normal. Judgment and thought content normal.    Labs reviewed: Basic Metabolic Panel:  Recent Labs  06/16/15 2059 06/17/15 1925  NA 123* 120*  K 3.9 4.8  CL 88* 84*  CO2 23 23  GLUCOSE 140* 129*  BUN 8 10  CREATININE 0.53 0.48  CALCIUM 8.8* 8.9   Liver Function Tests:  Recent Labs  06/16/15 2059 06/17/15 1925  AST 62* 66*  ALT 68* 81*  ALKPHOS 57 61  BILITOT 1.4* 1.3*  PROT 6.5 6.1*  ALBUMIN 3.7 3.5    Recent Labs  06/16/15 2059 06/17/15 1925  LIPASE 21 23   No results for input(s): AMMONIA in the last 8760 hours. CBC:  Recent Labs  06/16/15 2059 06/17/15 1925  WBC 6.2 5.9  HGB 13.9 14.5  HCT 40.2 40.7  MCV 93.3 91.1  PLT 177 176   Cardiac Enzymes: No results for input(s): CKTOTAL, CKMB, CKMBINDEX, TROPONINI in the last 8760 hours. BNP: Invalid input(s): POCBNP No results found for: HGBA1C No results found for: TSH No results found for: VITAMINB12 No results found for: FOLATE No results found for: IRON, TIBC, FERRITIN  Imaging and Procedures obtained prior to SNF admission: US Abdomen Limited Ruq  06/17/2015  CLINICAL DATA:  Abnormal liver enzymes EXAM: US ABDOMEN LIMITED - RIGHT UPPER QUADRANT COMPARISON:  CT abdomen and pelvis January 20, 2006 FINDINGS:  Gallbladder: No gallstones or wall thickening visualized. There is no pericholecystic fluid. No sonographic Murphy sign noted by sonographer. Common bile duct: Diameter: 5 mm. There is no intrahepatic or extrahepatic  biliary duct dilatation. Liver: No focal lesion identified. Within normal limits in parenchymal echogenicity. IMPRESSION: Study within normal limits. Electronically Signed   By: Lowella Grip III M.D.   On: 06/17/2015 10:20    Assessment/Plan 1. Hyponatremia -CMP  2. Atrial fibrillation, unspecified type (HCC) -TSH  3. Acute on chronic systolic congestive heart failure ([place]) Compensated  4. Coronary artery disease involving coronary bypass graft of native heart without angina pectoris Asymptomatic  5. LBBB (left bundle branch block) Asymptomatic  6. Nonsustained ventricular tachycardia (HCC) Asymptomatic and resolved  7. Dyspnea Mild residual dyspnea from her recent pneumonia  8. IBS (irritable bowel syndrome) Asymptomatic  9. Pneumonia, unspecified laterality, unspecified part of lung -CBC  10. Weakness generalized -Enrolled in PT and OT to improve strength, safe mobility, and self-care skills.  11. Edema of both legs Continue current medication  12. Debility PT and OT

## 2015-06-26 ENCOUNTER — Ambulatory Visit (INDEPENDENT_AMBULATORY_CARE_PROVIDER_SITE_OTHER): Payer: Medicare Other | Admitting: Internal Medicine

## 2015-06-26 ENCOUNTER — Encounter: Payer: Self-pay | Admitting: Internal Medicine

## 2015-06-26 ENCOUNTER — Telehealth: Payer: Self-pay | Admitting: Internal Medicine

## 2015-06-26 VITALS — BP 124/56 | HR 72 | Ht 63.0 in | Wt 155.2 lb

## 2015-06-26 DIAGNOSIS — E871 Hypo-osmolality and hyponatremia: Secondary | ICD-10-CM | POA: Diagnosis not present

## 2015-06-26 DIAGNOSIS — R0600 Dyspnea, unspecified: Secondary | ICD-10-CM

## 2015-06-26 DIAGNOSIS — I25810 Atherosclerosis of coronary artery bypass graft(s) without angina pectoris: Secondary | ICD-10-CM | POA: Diagnosis not present

## 2015-06-26 LAB — CBC
HEMATOCRIT: 39.3 % (ref 36.0–46.0)
Hemoglobin: 13.2 g/dL (ref 12.0–15.0)
MCH: 31.3 pg (ref 26.0–34.0)
MCHC: 33.6 g/dL (ref 30.0–36.0)
MCV: 93.1 fL (ref 78.0–100.0)
MPV: 9.6 fL (ref 8.6–12.4)
Platelets: 252 10*3/uL (ref 150–400)
RBC: 4.22 MIL/uL (ref 3.87–5.11)
RDW: 13 % (ref 11.5–15.5)
WBC: 8.8 10*3/uL (ref 4.0–10.5)

## 2015-06-26 LAB — BASIC METABOLIC PANEL
BUN: 8 mg/dL (ref 7–25)
CO2: 28 mmol/L (ref 20–31)
CREATININE: 0.48 mg/dL — AB (ref 0.60–0.88)
Calcium: 8.4 mg/dL — ABNORMAL LOW (ref 8.6–10.4)
Chloride: 93 mmol/L — ABNORMAL LOW (ref 98–110)
GLUCOSE: 104 mg/dL — AB (ref 65–99)
POTASSIUM: 3.2 mmol/L — AB (ref 3.5–5.3)
Sodium: 132 mmol/L — ABNORMAL LOW (ref 135–146)

## 2015-06-26 LAB — TSH: TSH: 5.3 mIU/L — ABNORMAL HIGH

## 2015-06-26 NOTE — Telephone Encounter (Signed)
Pt saw Dr. Harrington Challenger today and [facility] needs clarification on paperwork sent with pt --pls call [phone]

## 2015-06-26 NOTE — Telephone Encounter (Signed)
Wanted clarification as to whether labs were drawn today or need drawn. Adv done at [facility] today.

## 2015-06-26 NOTE — Progress Notes (Signed)
Cardiology Office Note   Date:  06/26/2015   ID:  Darlene Collins, DOB 03-06-1929, MRN [id number]  PCP:  Jerlyn Ly, MD  Cardiologist:   Dorris Carnes, MD   No chief complaint on file.  F/U of atrial fibrillation and CHF     History of Present Illness: Darlene Collins is a 82 y.o. female with no known cardiac history  SHe was seen in [facility] ER x 2 this month  Left before treatment because wait was so long   Went to [facility] in [place]  NA was 117  Admtted to ICU initially 2/16 to 2/20  D/C'd from hospital on 2/22  nOw at [facility]  Since d/c she says her breathing is OK  She has never had palpitations.  No CP  Thirsty  Wants to know when she can have more fluid   Daughter says her energy hs been low for a long time      Outpatient Prescriptions Prior to Visit  Medication Sig Dispense Refill  . apixaban (ELIQUIS) 5 MG TABS tablet Take 5 mg by mouth 2 (two) times daily.    . carvedilol (COREG) 6.25 MG tablet Take 6.25 mg by mouth 2 (two) times daily with a meal.    . cefdinir (OMNICEF) 300 MG capsule Take 300 mg by mouth 2 (two) times daily. Stop date 06/26/15    . EVISTA 60 MG tablet 3 times a week    . furosemide (LASIX) 40 MG tablet Take 40 mg by mouth daily.    . Multiple Vitamins-Minerals (CENTRUM SILVER PO) Take by mouth daily.    . naproxen sodium (ANAPROX) 220 MG tablet Take 220 mg by mouth as needed.    Marland Kitchen spironolactone (ALDACTONE) 25 MG tablet Take 25 mg by mouth daily.    . Vitamin D, Ergocalciferol, (DRISDOL) 50000 UNITS CAPS Once a week.    . ezetimibe (ZETIA) 10 MG tablet Take 10 mg by mouth. Reported on 06/26/2015    . rosuvastatin (CRESTOR) 5 MG tablet Take 5 mg by mouth. Reported on 06/26/2015     No facility-administered medications prior to visit.     Allergies:   Aspirin; Penicillins; and Sulfa antibiotics   Past Medical History  Diagnosis Date  . High cholesterol   . Hemorrhoids     Past Surgical History  Procedure Laterality Date  .  Tonsillectomy and adenoidectomy    . Cataracts       Social History:  The patient  reports that she quit smoking about 38 years ago. Her smoking use included Cigarettes. She has a 25 pack-year smoking history. She has never used smokeless tobacco. She reports that she drinks alcohol. She reports that she does not use illicit drugs.   Family History:  The patient's family history includes Heart attack in her father.    ROS:  Please see the history of present illness. All other systems are reviewed and  Negative to the above problem except as noted.    PHYSICAL EXAM: VS:  BP 124/56 mmHg  Pulse 72  Ht 5\' 3"  (1.6 m)  Wt 155 lb 3.2 oz (70.398 kg)  BMI 27.50 kg/m2  SpO2 97%  GEN: Well nourished, well developed, in no acute distress HEENT: normal Neck: no JVD, carotid bruits, or masses Cardiac: RRR; Gr III?Vi systolic murmur LSB to apex  rubs, or gallops,Tr edema  Respiratory:  clear to auscultation bilaterally, normal work of breathing GI: soft, nontender, nondistended, + BS  No hepatomegaly  MS: no deformity Moving all extremities   Skin: warm and dry, no rash Neuro:  Strength and sensation are intact Psych: euthymic mood, full affect   EKG:  EKG is ordered today.  SR with PACs and PVCs   91 bpm  First degree AV block  PR 220 msec  LBBB.     Lipid Panel No results found for: CHOL, TRIG, HDL, CHOLHDL, VLDL, LDLCALC, LDLDIRECT    Wt Readings from Last 3 Encounters:  06/26/15 155 lb 3.2 oz (70.398 kg)  06/25/15 160 lb (72.576 kg)  10/31/11 171 lb 6.4 oz (77.747 kg)      ASSESSMENT AND PLAN:  1  Systolic CHF  New Dx   Will get records from [facility]   Volume status is fair Discussed with family  Will need to determine work up (myoview vs cath)  Determine after review of records  Will get labs today  2  Atrial fib  Reported while at [facility]  Continue anticoagulation.    F/U based on review of records     Signed, Dorris Carnes, MD  06/26/2015 2:42 PM    [facility] [address], [place], [place]  [id number] Phone: [phone]; Fax: [phone]

## 2015-06-26 NOTE — Patient Instructions (Signed)
Your physician recommends that you continue on your current medications as directed. Please refer to the Current Medication list given to you today. Your physician recommends that you return for lab work in: today (BMET, BNP, CBC, TSH) Follow up with your physician will depend on test results and review of records from [facility].

## 2015-06-27 LAB — BRAIN NATRIURETIC PEPTIDE: Brain Natriuretic Peptide: 1841.4 pg/mL — ABNORMAL HIGH (ref <100)

## 2015-06-29 ENCOUNTER — Other Ambulatory Visit: Payer: Self-pay | Admitting: *Deleted

## 2015-06-29 NOTE — Telephone Encounter (Signed)
Notes Recorded by Fay Records, MD on 06/28/2015 at 9:04 PM Would increase lasix to 60 mg per day Add KCL 40 Meq 1x per day Needs to have free T3, Free T4 checked  Volume still up F/U BMET and BNP in 1 wk   Called [facility] per daughter to report medication instructions/changes.

## 2015-06-29 NOTE — Telephone Encounter (Signed)
-----   Message from Fay Records, MD sent at 06/28/2015  9:05 PM EST ----- Daughter needs to be notified.

## 2015-07-01 ENCOUNTER — Encounter: Payer: Self-pay | Admitting: Nurse Practitioner

## 2015-07-01 ENCOUNTER — Non-Acute Institutional Stay (SKILLED_NURSING_FACILITY): Payer: Medicare Other | Admitting: Nurse Practitioner

## 2015-07-01 DIAGNOSIS — I5020 Unspecified systolic (congestive) heart failure: Secondary | ICD-10-CM | POA: Diagnosis not present

## 2015-07-01 DIAGNOSIS — E876 Hypokalemia: Secondary | ICD-10-CM

## 2015-07-01 DIAGNOSIS — R600 Localized edema: Secondary | ICD-10-CM | POA: Diagnosis not present

## 2015-07-01 DIAGNOSIS — E871 Hypo-osmolality and hyponatremia: Secondary | ICD-10-CM | POA: Diagnosis not present

## 2015-07-01 DIAGNOSIS — I480 Paroxysmal atrial fibrillation: Secondary | ICD-10-CM | POA: Diagnosis not present

## 2015-07-01 HISTORY — DX: Hypokalemia: E87.6

## 2015-07-01 NOTE — Assessment & Plan Note (Signed)
Heart rate is in control, continue Carvedilol 6.25mg  bid, Eliquis 5mg  bid for thromboembolic risk reduction.

## 2015-07-01 NOTE — Progress Notes (Signed)
Patient ID: Darlene Collins, female   DOB: 1928-10-12, 82 y.o.   MRN: [id number]  Location:  [place] of Service:  SNF (31) Provider: Lennie Odor Maye Parkinson NP  Jerlyn Ly, MD  Patient Care Team: Crist Infante, MD as PCP - General (Internal Medicine)  Extended Emergency Contact Information Primary Emergency Contact: Tedeschi,Robert Address: [place] [address] [place] Phone: [phone] Mobile Phone: [phone] Relation: Spouse  Code Status: full code Goals of care: Advanced Directive information Advanced Directives 06/25/2015  Does patient have an advance directive? Yes  Type of Advance Directive Out of facility DNR (pink MOST or yellow form);Living will  Does patient want to make changes to advanced directive? -  Copy of advanced directive(s) in chart? Yes     Chief Complaint  Patient presents with  . Medical Management of Chronic Issues  . Acute Visit    hyponatremia, hypkalemia    HPI:  Pt is a 82 y.o. female seen today for an acute visit for Na 133, K 3.4, taking Furosemide 40mg  and Spironolactone 25mg  daily, previous Na 132, K 3.2 06/28/15. Hx of Afib, rate controlled on Carvedilol 6.25mg  bid, Eliquis 5mg  bid for thromboembolic risk reduction. Hx of Afib, heart rate is controlled on Carvedilol. CHF compensated on Furosemide and Spironolactone.    Past Medical History  Diagnosis Date  . High cholesterol   . Hemorrhoids    Past Surgical History  Procedure Laterality Date  . Tonsillectomy and adenoidectomy    . Cataracts      Allergies  Allergen Reactions  . Aspirin     Makes her feel unpleasant  . Penicillins     " Patient unable to remember reaction"  . Sulfa Antibiotics     " Patient unable to remember reaction"      Medication List       This list is accurate as of: 07/01/15  5:17 PM.  Always use your most recent med list.               carvedilol 6.25 MG tablet  Commonly known as:   COREG  Take 6.25 mg by mouth 2 (two) times daily with a meal.     cefdinir 300 MG capsule  Commonly known as:  OMNICEF  Take 300 mg by mouth 2 (two) times daily. Stop date 06/26/15     CENTRUM SILVER PO  Take by mouth daily.     ELIQUIS 5 MG Tabs tablet  Generic drug:  apixaban  Take 5 mg by mouth 2 (two) times daily.     EVISTA 60 MG tablet  Generic drug:  raloxifene  3 times a week     furosemide 40 MG tablet  Commonly known as:  LASIX  Take 60 mg by mouth daily.     naproxen sodium 220 MG tablet  Commonly known as:  ANAPROX  Take 220 mg by mouth as needed.     spironolactone 25 MG tablet  Commonly known as:  ALDACTONE  Take 25 mg by mouth daily.     Vitamin D (Ergocalciferol) 50000 units Caps capsule  Commonly known as:  DRISDOL  Once a week.        Review of Systems  Constitutional: Negative for fever, chills, diaphoresis, activity change, appetite change, fatigue and unexpected weight change.  HENT: Negative for congestion, dental problem, drooling, ear discharge, ear pain, facial swelling, hearing loss and mouth sores.  Eyes: Negative for photophobia, pain, discharge, redness, itching and visual disturbance.  Respiratory: Negative for apnea, cough, choking, chest tightness, shortness of breath, wheezing and stridor.   Cardiovascular: Positive for leg swelling. Negative for chest pain and palpitations.       Trace edema in ankles  Gastrointestinal: Negative for nausea, abdominal pain, diarrhea, constipation, blood in stool, abdominal distention, anal bleeding and rectal pain.  Genitourinary: Negative for dysuria, frequency, hematuria, flank pain, enuresis, difficulty urinating and dyspareunia.  Musculoskeletal: Positive for gait problem. Negative for myalgias, back pain, arthralgias and neck pain.  Skin: Negative for color change, pallor, rash and wound.  Neurological: Negative for dizziness, seizures, syncope, facial asymmetry, speech difficulty,  light-headedness, numbness and headaches.  Hematological: Negative for adenopathy. Does not bruise/bleed easily.  Psychiatric/Behavioral: Negative for hallucinations, behavioral problems, confusion, decreased concentration and agitation. The patient is not nervous/anxious.     Immunization History  Administered Date(s) Administered  . Influenza Whole 01/01/2011   Pertinent  Health Maintenance Due  Topic Date Due  . DEXA SCAN  09/23/1995  . PNA vac Low Risk Adult (1 of 2 - PCV13) 09/23/1995  . INFLUENZA VACCINE  12/01/2014   No flowsheet data found. Functional Status Survey:    Filed Vitals:   07/01/15 1712  BP: 118/58  Pulse: 76  Temp: 98.7 F (37.1 C)  TempSrc: Tympanic  Resp: 20   There is no weight on file to calculate BMI. Physical Exam  Constitutional: She is oriented to person, place, and time. She appears well-developed and well-nourished. No distress.  HENT:  Head: Normocephalic and atraumatic.  Left Ear: External ear normal.  Mouth/Throat: Oropharynx is clear and moist.  Eyes: Conjunctivae and EOM are normal. Pupils are equal, round, and reactive to light. Right eye exhibits no discharge. Left eye exhibits no discharge.  Neck: Normal range of motion. Neck supple. No tracheal deviation present. No thyromegaly present.  Cardiovascular: Normal rate.   No murmur heard. Pulmonary/Chest: Effort normal and breath sounds normal. No respiratory distress. She has no wheezes. She has no rales.  Abdominal: Soft. Bowel sounds are normal. She exhibits no distension. There is no tenderness. There is no rebound.  Musculoskeletal: Normal range of motion. She exhibits edema. She exhibits no tenderness.  Trace edema in ankles.   Neurological: She is alert and oriented to person, place, and time. She has normal reflexes. No cranial nerve deficit. Coordination normal.  Skin: Skin is warm and dry. No rash noted. She is not diaphoretic. No erythema.  Psychiatric: She has a normal mood  and affect. Her behavior is normal. Judgment and thought content normal.    Labs reviewed:  Recent Labs  06/16/15 2059 06/17/15 1925 06/26/15 1527  NA 123* 120* 132*  K 3.9 4.8 3.2*  CL 88* 84* 93*  CO2 23 23 28   GLUCOSE 140* 129* 104*  BUN 8 10 8   CREATININE 0.53 0.48 0.48*  CALCIUM 8.8* 8.9 8.4*    Recent Labs  06/16/15 2059 06/17/15 1925  AST 62* 66*  ALT 68* 81*  ALKPHOS 57 61  BILITOT 1.4* 1.3*  PROT 6.5 6.1*  ALBUMIN 3.7 3.5    Recent Labs  06/16/15 2059 06/17/15 1925 06/26/15 1527  WBC 6.2 5.9 8.8  HGB 13.9 14.5 13.2  HCT 40.2 40.7 39.3  MCV 93.3 91.1 93.1  PLT 177 176 252   Lab Results  Component Value Date   TSH 5.30* 06/26/2015   No results found for: HGBA1C No results found for: CHOL, HDL,  LDLCALC, LDLDIRECT, TRIG, CHOLHDL  Significant Diagnostic Results in last 30 days:  US Abdomen Limited Ruq  06/17/2015  CLINICAL DATA:  Abnormal liver enzymes EXAM: US ABDOMEN LIMITED - RIGHT UPPER QUADRANT COMPARISON:  CT abdomen and pelvis January 20, 2006 FINDINGS: Gallbladder: No gallstones or wall thickening visualized. There is no pericholecystic fluid. No sonographic Murphy sign noted by sonographer. Common bile duct: Diameter: 5 mm. There is no intrahepatic or extrahepatic biliary duct dilatation. Liver: No focal lesion identified. Within normal limits in parenchymal echogenicity. IMPRESSION: Study within normal limits. Electronically Signed   By: Lowella Grip III M.D.   On: 06/17/2015 10:20    Assessment/Plan: Hyponatremia 06/30/15 Na 133, 132 06/28/15, update BMP in one week.   Atrial fibrillation (HCC) Heart rate is in control, continue Carvedilol 6.25mg  bid, Eliquis 5mg  bid for thromboembolic risk reduction.   Systolic CHF ([place]) Clinically compensated, continue Furosemide and Spironolactone.   Edema of both legs Trace edema seen BLE  Hypokalemia K 3.4 06/30/15, 3.2 06/28/15, related to diuretics, update BMP in one week.      Family/  staff Communication: continue SNF for rehab, goal is to return [place] when able.   Labs/tests ordered: BMP one week.

## 2015-07-01 NOTE — Assessment & Plan Note (Signed)
06/30/15 Na 133, 132 06/28/15, update BMP in one week.

## 2015-07-01 NOTE — Assessment & Plan Note (Signed)
Trace edema seen BLE

## 2015-07-01 NOTE — Assessment & Plan Note (Signed)
K 3.4 06/30/15, 3.2 06/28/15, related to diuretics, update BMP in one week.

## 2015-07-01 NOTE — Assessment & Plan Note (Signed)
Clinically compensated, continue Furosemide and Spironolactone.

## 2015-07-02 MED ORDER — POTASSIUM CHLORIDE CRYS ER 20 MEQ PO TBCR: 40.0000 meq | EXTENDED_RELEASE_TABLET | Freq: Every day | ORAL | Status: DC

## 2015-07-07 LAB — BASIC METABOLIC PANEL
BUN: 19 mg/dL (ref 4–21)
Creatinine: 0.7 mg/dL (ref 0.5–1.1)
Glucose: 87 mg/dL
POTASSIUM: 4.8 mmol/L (ref 3.4–5.3)
Sodium: 134 mmol/L — AB (ref 137–147)

## 2015-07-09 DIAGNOSIS — M5136 Other intervertebral disc degeneration, lumbar region: Secondary | ICD-10-CM | POA: Diagnosis not present

## 2015-07-09 DIAGNOSIS — I509 Heart failure, unspecified: Secondary | ICD-10-CM | POA: Diagnosis not present

## 2015-07-09 DIAGNOSIS — I4891 Unspecified atrial fibrillation: Secondary | ICD-10-CM | POA: Diagnosis not present

## 2015-07-10 ENCOUNTER — Ambulatory Visit: Payer: Medicare Other | Admitting: Cardiovascular Disease

## 2015-07-10 DIAGNOSIS — M5136 Other intervertebral disc degeneration, lumbar region: Secondary | ICD-10-CM | POA: Diagnosis not present

## 2015-07-10 DIAGNOSIS — K649 Unspecified hemorrhoids: Secondary | ICD-10-CM | POA: Diagnosis not present

## 2015-07-10 DIAGNOSIS — M6281 Muscle weakness (generalized): Secondary | ICD-10-CM | POA: Diagnosis not present

## 2015-07-10 DIAGNOSIS — M545 Low back pain: Secondary | ICD-10-CM | POA: Diagnosis not present

## 2015-07-10 DIAGNOSIS — R41841 Cognitive communication deficit: Secondary | ICD-10-CM | POA: Diagnosis not present

## 2015-07-13 ENCOUNTER — Telehealth: Payer: Self-pay | Admitting: Internal Medicine

## 2015-07-13 DIAGNOSIS — K649 Unspecified hemorrhoids: Secondary | ICD-10-CM | POA: Diagnosis not present

## 2015-07-13 DIAGNOSIS — R41841 Cognitive communication deficit: Secondary | ICD-10-CM | POA: Diagnosis not present

## 2015-07-13 DIAGNOSIS — M6281 Muscle weakness (generalized): Secondary | ICD-10-CM | POA: Diagnosis not present

## 2015-07-13 DIAGNOSIS — M5136 Other intervertebral disc degeneration, lumbar region: Secondary | ICD-10-CM | POA: Diagnosis not present

## 2015-07-13 DIAGNOSIS — M545 Low back pain: Secondary | ICD-10-CM | POA: Diagnosis not present

## 2015-07-13 DIAGNOSIS — I5020 Unspecified systolic (congestive) heart failure: Secondary | ICD-10-CM

## 2015-07-13 DIAGNOSIS — E871 Hypo-osmolality and hyponatremia: Secondary | ICD-10-CM

## 2015-07-13 DIAGNOSIS — R600 Localized edema: Secondary | ICD-10-CM

## 2015-07-13 NOTE — Telephone Encounter (Signed)
Returned call to patient's daughter Vaughan Basta.She wanted to know if we received 07/07/15 lab from [facility].Stated she saw PCP and he advised her to call Dr.Ross for advice if mother needs fluid restrictions.Also wants to know if mother needs appointment with Dr.Ross.Advised Dr.Ross's nurse out of office.I will send message to her.

## 2015-07-13 NOTE — Telephone Encounter (Signed)
New Message:  Did you get her lab results from 07-07-15,she had this at [facility]? If you did,should she stay on the same medicine and does she need to stay on her fluid restriction? If yes,is this indefinite? Does she need another appointment with Dr Harrington Challenger or should she just see her primary doctor?Darlene Collins

## 2015-07-13 NOTE — Telephone Encounter (Signed)
Left message to call back  

## 2015-07-15 DIAGNOSIS — M545 Low back pain: Secondary | ICD-10-CM | POA: Diagnosis not present

## 2015-07-15 DIAGNOSIS — K649 Unspecified hemorrhoids: Secondary | ICD-10-CM | POA: Diagnosis not present

## 2015-07-15 DIAGNOSIS — R41841 Cognitive communication deficit: Secondary | ICD-10-CM | POA: Diagnosis not present

## 2015-07-15 DIAGNOSIS — M6281 Muscle weakness (generalized): Secondary | ICD-10-CM | POA: Diagnosis not present

## 2015-07-15 DIAGNOSIS — M5136 Other intervertebral disc degeneration, lumbar region: Secondary | ICD-10-CM | POA: Diagnosis not present

## 2015-07-15 NOTE — Telephone Encounter (Signed)
Will forward to Dr. Harrington Challenger and make aware that labs are now in [facility] and need reviewed.   Will call patient's daughter back once Dr. Harrington Challenger reviews and has new recommendations re: lasix/follow up/fluid restriction.

## 2015-07-16 NOTE — Telephone Encounter (Signed)
Informed patient's daughter

## 2015-07-16 NOTE — Telephone Encounter (Signed)
Called patient's daughter Vaughan Basta to inform. Patient has been discharged from [facility] and is back at home.  Scheduled appointment with Lyda Jester for next Wed to f/u edema.  Pt is drinking 1000-1200 ml daily of liquid and she c/o being thirsty.  Would like to know if Dr. Harrington Challenger will decrease fluid restriction so she can have more to drink.  Patient's last sodium level was 134.  Vaughan Basta is aware I am forwarding to Dr. Harrington Challenger for any new recommendation and will call her back whether she is to stay on same restriction or can have more to drink.

## 2015-07-16 NOTE — Telephone Encounter (Signed)
Sodium is OK I would keep on current meds   I sent note that I have reviewed records   Should be seen in clinic to f/u edema I am not in next week Could be followed by PA next wek Or I can see week after

## 2015-07-16 NOTE — Telephone Encounter (Signed)
REview of records from [place].     Given that pt has not had CP I would recommend a lexiscan myovue to evaluate for ischemia.    Get BMET and BNP when she comes in for labs      Above notes recorded by Dr. Harrington Challenger.   Left daughter Vaughan Basta a message to call back to discuss patient having myoview/labs Orders placed.

## 2015-07-16 NOTE — Telephone Encounter (Signed)
Patient's daughter called back. Explained nuclear study and instructions. She verbalizes understanding to these and that someone will call to schedule stress test and labs for same day.

## 2015-07-16 NOTE — Telephone Encounter (Signed)
Would expand to 1500 cc

## 2015-07-17 ENCOUNTER — Ambulatory Visit: Payer: Medicare Other | Admitting: Physician Assistant

## 2015-07-20 DIAGNOSIS — K649 Unspecified hemorrhoids: Secondary | ICD-10-CM | POA: Diagnosis not present

## 2015-07-20 DIAGNOSIS — M545 Low back pain: Secondary | ICD-10-CM | POA: Diagnosis not present

## 2015-07-20 DIAGNOSIS — M5136 Other intervertebral disc degeneration, lumbar region: Secondary | ICD-10-CM | POA: Diagnosis not present

## 2015-07-20 DIAGNOSIS — M6281 Muscle weakness (generalized): Secondary | ICD-10-CM | POA: Diagnosis not present

## 2015-07-20 DIAGNOSIS — R41841 Cognitive communication deficit: Secondary | ICD-10-CM | POA: Diagnosis not present

## 2015-07-22 ENCOUNTER — Ambulatory Visit: Payer: Medicare Other | Admitting: Cardiology

## 2015-07-22 ENCOUNTER — Ambulatory Visit (INDEPENDENT_AMBULATORY_CARE_PROVIDER_SITE_OTHER): Payer: Medicare Other | Admitting: Nurse Practitioner

## 2015-07-22 ENCOUNTER — Encounter: Payer: Self-pay | Admitting: Nurse Practitioner

## 2015-07-22 VITALS — BP 124/72 | HR 76 | Ht 63.0 in | Wt 142.8 lb

## 2015-07-22 DIAGNOSIS — I5022 Chronic systolic (congestive) heart failure: Secondary | ICD-10-CM

## 2015-07-22 DIAGNOSIS — K649 Unspecified hemorrhoids: Secondary | ICD-10-CM | POA: Diagnosis not present

## 2015-07-22 DIAGNOSIS — I5020 Unspecified systolic (congestive) heart failure: Secondary | ICD-10-CM

## 2015-07-22 DIAGNOSIS — R600 Localized edema: Secondary | ICD-10-CM | POA: Diagnosis not present

## 2015-07-22 DIAGNOSIS — M545 Low back pain: Secondary | ICD-10-CM | POA: Diagnosis not present

## 2015-07-22 DIAGNOSIS — E871 Hypo-osmolality and hyponatremia: Secondary | ICD-10-CM | POA: Diagnosis not present

## 2015-07-22 DIAGNOSIS — R41841 Cognitive communication deficit: Secondary | ICD-10-CM | POA: Diagnosis not present

## 2015-07-22 DIAGNOSIS — M5136 Other intervertebral disc degeneration, lumbar region: Secondary | ICD-10-CM | POA: Diagnosis not present

## 2015-07-22 DIAGNOSIS — M6281 Muscle weakness (generalized): Secondary | ICD-10-CM | POA: Diagnosis not present

## 2015-07-22 DIAGNOSIS — I25810 Atherosclerosis of coronary artery bypass graft(s) without angina pectoris: Secondary | ICD-10-CM

## 2015-07-22 LAB — BASIC METABOLIC PANEL
BUN: 9 mg/dL (ref 7–25)
CO2: 29 mmol/L (ref 20–31)
Calcium: 9.5 mg/dL (ref 8.6–10.4)
Chloride: 95 mmol/L — ABNORMAL LOW (ref 98–110)
Creat: 0.64 mg/dL (ref 0.60–0.88)
Glucose, Bld: 98 mg/dL (ref 65–99)
Potassium: 4.4 mmol/L (ref 3.5–5.3)
Sodium: 135 mmol/L (ref 135–146)

## 2015-07-22 LAB — CBC
HCT: 41.9 % (ref 36.0–46.0)
Hemoglobin: 14.2 g/dL (ref 12.0–15.0)
MCH: 30.7 pg (ref 26.0–34.0)
MCHC: 33.9 g/dL (ref 30.0–36.0)
MCV: 90.7 fL (ref 78.0–100.0)
MPV: 9.6 fL (ref 8.6–12.4)
Platelets: 235 10*3/uL (ref 150–400)
RBC: 4.62 MIL/uL (ref 3.87–5.11)
RDW: 12.6 % (ref 11.5–15.5)
WBC: 6.8 10*3/uL (ref 4.0–10.5)

## 2015-07-22 MED ORDER — POTASSIUM CHLORIDE CRYS ER 20 MEQ PO TBCR: 20.0000 meq | EXTENDED_RELEASE_TABLET | Freq: Every day | ORAL | Status: DC

## 2015-07-22 MED ORDER — FUROSEMIDE 40 MG PO TABS: 40.0000 mg | ORAL_TABLET | Freq: Every day | ORAL | Status: DC

## 2015-07-22 NOTE — Patient Instructions (Signed)
We will be checking the following labs today - BMET, BNP, CBC   Medication Instructions:    Continue with your current medicines. BUT  Cut the Lasix back to just 40 mg a day - this has been sent to the drug store  Cut the potassium back 20 meq daily - this has been sent to the drug store    Testing/Procedures To Be Arranged:  Will arrange for your stress test  Follow-Up:   See Dr. Harrington Challenger in about a month    Other Special Instructions:   N/A    If you need a refill on your cardiac medications before your next appointment, please call your pharmacy.   Call the [place] office at [phone] if you have any questions, problems or concerns.

## 2015-07-22 NOTE — Progress Notes (Signed)
CARDIOLOGY OFFICE NOTE  Date:  07/22/2015    Darlene Collins Date of Birth: 1928/06/05 Medical Record [id number]  PCP:  Jerlyn Ly, MD  Cardiologist:  Harrington Challenger    Chief Complaint  Patient presents with  . Congestive Heart Failure    Follow up visit - seen for Dr. Harrington Challenger    History of Present Illness: Darlene Collins is a 82 y.o. female who presents today for a follow up visit. Seen for Dr. Harrington Challenger.   She has a history of hyponatremia and HLD.   Admitted last month at [facility] (wait was too long at [facility]). Seen back a month ago by Dr. Harrington Challenger for her post op check. She had had a 10 day history of productive cough, had sodium of 117 and CXR with pneumonia. Noted to have PAF and NSVT. NaCl tabs stopped once the echo was obtained.   Comes back today. Here with her husband and daughter. Not clear why she is here - she says she does not know. But says she is doing very well. Basically back to her baseline.  Myoview ordered but was not scheduled. She is not able to give a reason for being on Eliquis.   She is doing well. She denies chest pain. Says her breathing is ok. No palpitations. Her weight is actually below her weight when she got sick. No swelling. She would like to have her medicines cut back. No bleeding noted. She is getting therapy and overall, feels stronger. No falls.    Past Medical History  Diagnosis Date  . High cholesterol   . Hemorrhoids   . Atrial fibrillation ([place])   . Systolic heart failure ([place])   . LBBB (left bundle branch block)   . Chronic anticoagulation     Past Surgical History  Procedure Laterality Date  . Tonsillectomy and adenoidectomy    . Cataracts       Medications: Current Outpatient Prescriptions  Medication Sig Dispense Refill  . apixaban (ELIQUIS) 5 MG TABS tablet Take 5 mg by mouth 2 (two) times daily.    . carvedilol (COREG) 6.25 MG tablet Take 6.25 mg by mouth 2 (two) times daily with a meal.    . EVISTA 60 MG tablet Take 60 mg by mouth  3 (three) times a week. 3 times a week    . furosemide (LASIX) 40 MG tablet Take 1 tablet (40 mg total) by mouth daily. 90 tablet 3  . Multiple Vitamins-Minerals (CENTRUM SILVER PO) Take 1 tablet by mouth daily.     . naproxen sodium (ANAPROX) 220 MG tablet Take 220 mg by mouth as needed.    . potassium chloride SA (K-DUR,KLOR-CON) 20 MEQ tablet Take 1 tablet (20 mEq total) by mouth daily. 90 tablet 3  . spironolactone (ALDACTONE) 25 MG tablet Take 25 mg by mouth daily.    . Vitamin D, Ergocalciferol, (DRISDOL) 50000 UNITS CAPS Take 50,000 Units by mouth every 7 (seven) days.      No current facility-administered medications for this visit.    Allergies: Allergies  Allergen Reactions  . Aspirin     Makes her feel unpleasant  . Penicillins     " Patient unable to remember reaction"  . Sulfa Antibiotics     " Patient unable to remember reaction"    Social History: The patient  reports that she quit smoking about 38 years ago. Her smoking use included Cigarettes. She has a 25 pack-year smoking history. She has never used smokeless  tobacco. She reports that she drinks alcohol. She reports that she does not use illicit drugs.   Family History: The patient's family history includes Heart attack in her father.   Review of Systems: Please see the history of present illness.   Otherwise, the review of systems is positive for none.   All other systems are reviewed and negative.   Physical Exam: VS:  BP 124/72 mmHg  Pulse 76  Ht 5\' 3"  (1.6 m)  Wt 142 lb 12.8 oz (64.774 kg)  BMI 25.30 kg/m2  SpO2 97% .  BMI Body mass index is 25.3 kg/(m^2).  Wt Readings from Last 3 Encounters:  07/22/15 142 lb 12.8 oz (64.774 kg)  06/26/15 155 lb 3.2 oz (70.398 kg)  06/25/15 160 lb (72.576 kg)    General: Pleasant. Elderly female who is alert and in no acute distress. Her weight is down 18 pounds over the past month.  HEENT: Normal. Neck: Supple, no JVD, carotid bruits, or masses noted.  Cardiac:  Fairly regular rate and rhythm. No murmurs, rubs, or gallops. No edema.  Respiratory:  Lungs are clear to auscultation bilaterally with normal work of breathing.  GI: Soft and nontender.  MS: No deformity or atrophy. Gait and ROM intact. She is using a cane. Skin: Warm and dry. Color is normal.  Neuro:  Strength and sensation are intact and no gross focal deficits noted.  Psych: Alert, appropriate and with normal affect.   LABORATORY DATA:  EKG:  EKG is not ordered today.  Lab Results  Component Value Date   WBC 8.8 06/26/2015   HGB 13.2 06/26/2015   HCT 39.3 06/26/2015   PLT 252 06/26/2015   GLUCOSE 104* 06/26/2015   ALT 81* 06/17/2015   AST 66* 06/17/2015   NA 134* 07/07/2015   K 4.8 07/07/2015   CL 93* 06/26/2015   CREATININE 0.7 07/07/2015   BUN 19 07/07/2015   CO2 28 06/26/2015   TSH 5.30* 06/26/2015    BNP (last 3 results) No results for input(s): BNP in the last 8760 hours.  ProBNP (last 3 results) No results for input(s): PROBNP in the last 8760 hours.   Other Studies Reviewed Today:  Echo Interpretation Summary from 06/2015 [facility]) A complete portable two-dimensional transthoracic echocardiogram with color flow Doppler and Spectral Doppler was performed. The study was technically adequate. The left ventricle is mildly dilated. There is mild concentric left ventricular hypertrophy. There is moderate diffuse hypokinesis of the left ventricle. The left ventricular ejection fraction is moderately reduced (35-40%). The left ventricular diastolic function is normal. The left atrium is mildly dilated. The mitral valve leaflets appear thickened, but open well. There is moderate (2+) mitral regurgitation. There is mild (1+) tricuspid regurgitation. The aortic valve is trileaflet. Mild aortic sclerosis is present with good valvular opening. There is moderate [2+] aortic regurgitation present.  Left Ventricle The left ventricle is mildly dilated. There  is mild concentric left ventricular hypertrophy. There is moderate diffuse hypokinesis of the left ventricle. The left ventricular ejection fraction is moderately reduced (35-40%). The left ventricular diastolic function is normal.   Right Ventricle The right ventricle is normal in structure and function.  Atria The left atrium is mildly dilated. The right atrium is normal.  Mitral Valve The mitral valve leaflets appear thickened, but open well. There is moderate (2+) mitral regurgitation.   Tricuspid Valve The tricuspid valve leaflets are thin and pliable and the valve motion is normal. There is mild (1+) tricuspid regurgitation.Right ventricular systolic  pressure is elevated between 30-39mm Hg, consistent with mild pulmonary hypertension.  Aortic Valve The aortic valve is trileaflet. Mild aortic sclerosis is present with good valvular opening. There is moderate [2+] aortic regurgitation present.  Pulmonic Valve The pulmonic valve leaflets are thin and pliable; valve motion is normal. There is trace pulmonic regurgitation.  Vessels The aortic root is normal in diameter.  Pericardium There is no pericardial effusion.  MMode/2D Measurements & Calculations IVSd: 1.3 cm LVIDd: 5.6 cm LVIDs: 4.7 cm LVPWd: 1.3 cm  _____________________________________________________________ LV mass(C)d: 326.5 grams Ao root diam: 3.1 cm  LV mass(C)dI: 181.0 grams/m2 Ao root area: 7.7 cm2 LA dimension: 4.8 cm _____________________________________________________________  EDV(sp4-el): 160.3 ml SV(MOD-sp4): 58.9 ml LVAs ap4: 30.6 cm2 LVLs ap4: 8.3 cm ESV(MOD-sp4): 93.8 ml ESV(sp4-el): 96.0 ml _____________________________________________________________  SV(sp4-el): 64.2 ml  Doppler Measurements & Calculations AI max vel: 449.6 cm/sec MR max vel: 447.5 cm/sec AI max PG: 80.9 mmHg MR max PG: 80.2 mmHg AI dec slope: 273.6 cm/sec2 AI P1/2t: 481.2  msec  _____________________________________________________________ TR max vel: 275.7 cm/sec MR ERO: 0.05 cm2 TR max PG: 30.4 mmHg MR volume: 8.4 ml RVSP(TR): 40.4 mmHg MR PISA radius: 0.40 cm _____________________________________________________________  RAP systole: 10.0 mmHg  ____________________________________________________________________________   Electronically signed [id number] Renaldo, M.D.,F.A.C.C. on 06/22/2015 03:37 PM Ordering Physician: [id number]   Assessment/Plan: 1. Prior pneumonia - seems recovered.   2. PAF - now on Eliquis - no problems noted - recheck   3. Hyponatremia - needs labs today.  4. Systolic HF - EF is [id number] - needs Myoview arranged to rule out CAD as etiology - will schedule. She is quite compensated. Cutting Lasix back to just 40 mg a day and potassium to just one pill a day. May be able to stop the potassium since she is on aldactone. Would favor repeat echo in 3 months to recheck.   5. LBBB  Current medicines are reviewed with the patient today.  The patient does not have concerns regarding medicines other than what has been noted above.  The following changes have been made:  See above.  Labs/ tests ordered today include:    Orders Placed This Encounter  Procedures  . Basic metabolic panel  . CBC  . Brain natriuretic peptide     Disposition:   FU with Dr. Harrington Challenger in 4 weeks.   Patient is agreeable to this plan and will call if any problems develop in the interim.   Signed: Burtis Junes, RN, ANP-C 07/22/2015 4:17 PM  [facility] [address] [place], [place]  [address] Phone: [phone] Fax: [phone]

## 2015-07-23 ENCOUNTER — Telehealth (HOSPITAL_COMMUNITY): Payer: Self-pay | Admitting: *Deleted

## 2015-07-23 LAB — BRAIN NATRIURETIC PEPTIDE: Brain Natriuretic Peptide: 205.1 pg/mL — ABNORMAL HIGH (ref <100)

## 2015-07-23 NOTE — Telephone Encounter (Signed)
Patient's daughter given detailed instructions per Myocardial Perfusion Study Information Sheet for the test on 07/27/15 at 0730. Patient notified to arrive 15 minutes early and that it is imperative to arrive on time for appointment to keep from having the test rescheduled.  If you need to cancel or reschedule your appointment, please call the office within 24 hours of your appointment. Failure to do so may result in a cancellation of your appointment, and a $50 no show fee. Patient verbalized understanding.Jacub Waiters, Ranae Palms

## 2015-07-24 ENCOUNTER — Other Ambulatory Visit: Payer: Self-pay

## 2015-07-24 DIAGNOSIS — I480 Paroxysmal atrial fibrillation: Secondary | ICD-10-CM

## 2015-07-24 DIAGNOSIS — I5020 Unspecified systolic (congestive) heart failure: Secondary | ICD-10-CM

## 2015-07-24 DIAGNOSIS — M6281 Muscle weakness (generalized): Secondary | ICD-10-CM | POA: Diagnosis not present

## 2015-07-24 DIAGNOSIS — M545 Low back pain: Secondary | ICD-10-CM | POA: Diagnosis not present

## 2015-07-24 DIAGNOSIS — I25810 Atherosclerosis of coronary artery bypass graft(s) without angina pectoris: Secondary | ICD-10-CM

## 2015-07-24 DIAGNOSIS — M5136 Other intervertebral disc degeneration, lumbar region: Secondary | ICD-10-CM | POA: Diagnosis not present

## 2015-07-24 DIAGNOSIS — K649 Unspecified hemorrhoids: Secondary | ICD-10-CM | POA: Diagnosis not present

## 2015-07-24 DIAGNOSIS — R41841 Cognitive communication deficit: Secondary | ICD-10-CM | POA: Diagnosis not present

## 2015-07-28 ENCOUNTER — Ambulatory Visit (HOSPITAL_COMMUNITY): Payer: Medicare Other | Attending: Cardiology

## 2015-07-28 VITALS — Ht 63.0 in | Wt 142.0 lb

## 2015-07-28 DIAGNOSIS — R600 Localized edema: Secondary | ICD-10-CM | POA: Diagnosis not present

## 2015-07-28 DIAGNOSIS — R0602 Shortness of breath: Secondary | ICD-10-CM | POA: Insufficient documentation

## 2015-07-28 DIAGNOSIS — I5020 Unspecified systolic (congestive) heart failure: Secondary | ICD-10-CM | POA: Diagnosis not present

## 2015-07-28 DIAGNOSIS — I517 Cardiomegaly: Secondary | ICD-10-CM | POA: Diagnosis not present

## 2015-07-28 DIAGNOSIS — I447 Left bundle-branch block, unspecified: Secondary | ICD-10-CM | POA: Insufficient documentation

## 2015-07-28 DIAGNOSIS — Z8249 Family history of ischemic heart disease and other diseases of the circulatory system: Secondary | ICD-10-CM | POA: Diagnosis not present

## 2015-07-28 DIAGNOSIS — E871 Hypo-osmolality and hyponatremia: Secondary | ICD-10-CM | POA: Diagnosis not present

## 2015-07-28 DIAGNOSIS — R079 Chest pain, unspecified: Secondary | ICD-10-CM

## 2015-07-28 LAB — MYOCARDIAL PERFUSION IMAGING
CHL CUP NUCLEAR SDS: 2
CHL CUP NUCLEAR SRS: 3
LHR: 0.22
LV sys vol: 92 mL
LVDIAVOL: 148 mL (ref 46–106)
Peak HR: 86 {beats}/min
Rest HR: 67 {beats}/min
SSS: 5
TID: 1.04

## 2015-07-28 MED ORDER — TECHNETIUM TC 99M SESTAMIBI GENERIC - CARDIOLITE
30.9000 | Freq: Once | INTRAVENOUS | Status: AC | PRN
  Administered 2015-07-28: 30.9 via INTRAVENOUS

## 2015-07-28 MED ORDER — REGADENOSON 0.4 MG/5ML IV SOLN
0.4000 mg | Freq: Once | INTRAVENOUS | Status: AC
  Administered 2015-07-28: 0.4 mg via INTRAVENOUS

## 2015-07-28 MED ORDER — TECHNETIUM TC 99M SESTAMIBI GENERIC - CARDIOLITE
10.8000 | Freq: Once | INTRAVENOUS | Status: AC | PRN
  Administered 2015-07-28: 11 via INTRAVENOUS

## 2015-07-29 DIAGNOSIS — K649 Unspecified hemorrhoids: Secondary | ICD-10-CM | POA: Diagnosis not present

## 2015-07-29 DIAGNOSIS — R41841 Cognitive communication deficit: Secondary | ICD-10-CM | POA: Diagnosis not present

## 2015-07-29 DIAGNOSIS — M5136 Other intervertebral disc degeneration, lumbar region: Secondary | ICD-10-CM | POA: Diagnosis not present

## 2015-07-29 DIAGNOSIS — M6281 Muscle weakness (generalized): Secondary | ICD-10-CM | POA: Diagnosis not present

## 2015-07-29 DIAGNOSIS — M545 Low back pain: Secondary | ICD-10-CM | POA: Diagnosis not present

## 2015-07-30 ENCOUNTER — Telehealth: Payer: Self-pay | Admitting: Internal Medicine

## 2015-07-30 NOTE — Telephone Encounter (Signed)
See result note for details.

## 2015-07-30 NOTE — Telephone Encounter (Signed)
New message ° ° ° ° ° °Calling to get stress test results °

## 2015-08-03 DIAGNOSIS — M5136 Other intervertebral disc degeneration, lumbar region: Secondary | ICD-10-CM | POA: Diagnosis not present

## 2015-08-03 DIAGNOSIS — K649 Unspecified hemorrhoids: Secondary | ICD-10-CM | POA: Diagnosis not present

## 2015-08-03 DIAGNOSIS — M545 Low back pain: Secondary | ICD-10-CM | POA: Diagnosis not present

## 2015-08-03 DIAGNOSIS — R2681 Unsteadiness on feet: Secondary | ICD-10-CM | POA: Diagnosis not present

## 2015-08-03 DIAGNOSIS — M6281 Muscle weakness (generalized): Secondary | ICD-10-CM | POA: Diagnosis not present

## 2015-08-05 DIAGNOSIS — I4891 Unspecified atrial fibrillation: Secondary | ICD-10-CM | POA: Diagnosis not present

## 2015-08-05 DIAGNOSIS — Z6825 Body mass index (BMI) 25.0-25.9, adult: Secondary | ICD-10-CM | POA: Diagnosis not present

## 2015-08-05 DIAGNOSIS — I509 Heart failure, unspecified: Secondary | ICD-10-CM | POA: Diagnosis not present

## 2015-08-07 DIAGNOSIS — R2681 Unsteadiness on feet: Secondary | ICD-10-CM | POA: Diagnosis not present

## 2015-08-07 DIAGNOSIS — M545 Low back pain: Secondary | ICD-10-CM | POA: Diagnosis not present

## 2015-08-07 DIAGNOSIS — K649 Unspecified hemorrhoids: Secondary | ICD-10-CM | POA: Diagnosis not present

## 2015-08-07 DIAGNOSIS — M6281 Muscle weakness (generalized): Secondary | ICD-10-CM | POA: Diagnosis not present

## 2015-08-07 DIAGNOSIS — M5136 Other intervertebral disc degeneration, lumbar region: Secondary | ICD-10-CM | POA: Diagnosis not present

## 2015-08-18 DIAGNOSIS — M5136 Other intervertebral disc degeneration, lumbar region: Secondary | ICD-10-CM | POA: Diagnosis not present

## 2015-08-21 ENCOUNTER — Encounter: Payer: Self-pay | Admitting: Internal Medicine

## 2015-08-21 ENCOUNTER — Ambulatory Visit (INDEPENDENT_AMBULATORY_CARE_PROVIDER_SITE_OTHER): Payer: Medicare Other | Admitting: Internal Medicine

## 2015-08-21 ENCOUNTER — Other Ambulatory Visit (INDEPENDENT_AMBULATORY_CARE_PROVIDER_SITE_OTHER): Payer: Medicare Other | Admitting: *Deleted

## 2015-08-21 VITALS — BP 126/66 | HR 86 | Ht 63.0 in | Wt 146.1 lb

## 2015-08-21 DIAGNOSIS — I25810 Atherosclerosis of coronary artery bypass graft(s) without angina pectoris: Secondary | ICD-10-CM

## 2015-08-21 DIAGNOSIS — I5020 Unspecified systolic (congestive) heart failure: Secondary | ICD-10-CM

## 2015-08-21 DIAGNOSIS — I480 Paroxysmal atrial fibrillation: Secondary | ICD-10-CM | POA: Diagnosis not present

## 2015-08-21 NOTE — Patient Instructions (Addendum)
Your physician recommends that you continue on your current medications as directed. Please refer to the Current Medication list given to you today. Your physician has requested that you have an echocardiogram. Echocardiography is a painless test that uses sound waves to create images of your heart. It provides your doctor with information about the size and shape of your heart and how well your heart's chambers and valves are working. This procedure takes approximately one hour. There are no restrictions for this procedure.--PLEASE SCHEDULE FOR EARLY JULY   Opened encounter--testing wide screen view

## 2015-08-21 NOTE — Progress Notes (Signed)
Cardiology Office Note   Date:  08/21/2015   ID:  IVIONA Collins, DOB [birth year], MRN [id number]  PCP:  Jerlyn Ly, MD  Cardiologist:   Dorris Carnes, MD   F/U of CHF     History of Present Illness: Darlene Collins is a 82 y.o. female with a history of systolic CHF and HL  She was last seen by Tommas Olp in March  I saw her prior to that   She was admitted in [place] earlier this winter with hyponatremia   Echo at [facility] LVEF was moderately depressed She has gone on to have lexiscan myoview on Mar 28  This showed no ischemia or scar   There was a area of incrased uptake on L beast, chest   recomm mammogram  Since I saw her she says she has been feeing good  Breathing is OK  No CP  No swelling   Wants to know why she is on all the meds   Wants to stop Pt a little angry         Outpatient Prescriptions Prior to Visit  Medication Sig Dispense Refill  . apixaban (ELIQUIS) 5 MG TABS tablet Take 5 mg by mouth 2 (two) times daily.    . carvedilol (COREG) 6.25 MG tablet Take 6.25 mg by mouth 2 (two) times daily with a meal.    . furosemide (LASIX) 40 MG tablet Take 1 tablet (40 mg total) by mouth daily. 90 tablet 3  . Multiple Vitamins-Minerals (CENTRUM SILVER PO) Take 1 tablet by mouth daily.     . naproxen sodium (ANAPROX) 220 MG tablet Take 220 mg by mouth as needed.    . potassium chloride SA (K-DUR,KLOR-CON) 20 MEQ tablet Take 1 tablet (20 mEq total) by mouth daily. 90 tablet 3  . spironolactone (ALDACTONE) 25 MG tablet Take 25 mg by mouth daily.    . Vitamin D, Ergocalciferol, (DRISDOL) 50000 UNITS CAPS Take 50,000 Units by mouth every 7 (seven) days.     Marland Kitchen EVISTA 60 MG tablet Take 60 mg by mouth 3 (three) times a week. Reported on 08/21/2015     No facility-administered medications prior to visit.     Allergies:   Aspirin; Penicillins; and Sulfa antibiotics   Past Medical History  Diagnosis Date  . High cholesterol   . Hemorrhoids   . Atrial fibrillation ([place])    . Systolic heart failure ([place])   . LBBB (left bundle branch block)   . Chronic anticoagulation     Past Surgical History  Procedure Laterality Date  . Tonsillectomy and adenoidectomy    . Cataracts       Social History:  The patient  reports that she quit smoking about 38 years ago. Her smoking use included Cigarettes. She has a 25 pack-year smoking history. She has never used smokeless tobacco. She reports that she drinks alcohol. She reports that she does not use illicit drugs.   Family History:  The patient's family history includes Heart attack in her father.    ROS:  Please see the history of present illness. All other systems are reviewed and  Negative to the above problem except as noted.    PHYSICAL EXAM: VS:  BP 126/66 mmHg  Pulse 86  Ht 5\' 3"  (1.6 m)  Wt 146 lb 1.9 oz (66.28 kg)  BMI 25.89 kg/m2  SpO2 94%  GEN: Well nourished, well developed, in no acute distress HEENT: normal Neck: no JVD, carotid bruits, or  masses Cardiac: RRR; no murmurs, rubs, or gallops,no edema  Respiratory:  clear to auscultation bilaterally, normal work of breathing GI: soft, nontender, nondistended, + BS  No hepatomegaly  MS: no deformity Moving all extremities   Skin: warm and dry, no rash Neuro:  Strength and sensation are intact Psych: euthymic mood, full affect   EKG:  EKG is not ordered today.   Lipid Panel No results found for: CHOL, TRIG, HDL, CHOLHDL, VLDL, LDLCALC, LDLDIRECT    Wt Readings from Last 3 Encounters:  08/21/15 146 lb 1.9 oz (66.28 kg)  07/28/15 142 lb (64.411 kg)  07/22/15 142 lb 12.8 oz (64.774 kg)      ASSESSMENT AND PLAN:  1  Chronic systolic CHF  VOlume status looks good  Pt is not on an ACEI or ARB  But she wants to come off meds   I would not recomm this  I discussed with pt and family  She will review with Dr Joylene Draft The pt's myovue showed no evid of ischemia or scar   Prob nonischemic CM   I will not make any changes today  I would recomm f/u  labs  F/U echo later this summer  2.  Abnromal myovue  Will review hot spt for further testing   3.  Hypnatremia  Would recomm BMET    Will be available as needed Leave up to pt and family   Will f/u with echo    Signed, Dorris Carnes, MD  08/21/2015 2:37 PM    [facility] [place], [place], [place]  [address] Phone: [phone]; Fax: [phone]

## 2015-08-28 NOTE — Progress Notes (Signed)
Darlene Collins,   Was this patient supposed to have a BMP on 4/21 with Dr. Harrington Challenger? This came to triage.  Thanks!

## 2015-09-15 DIAGNOSIS — M810 Age-related osteoporosis without current pathological fracture: Secondary | ICD-10-CM | POA: Diagnosis not present

## 2015-09-15 DIAGNOSIS — E784 Other hyperlipidemia: Secondary | ICD-10-CM | POA: Diagnosis not present

## 2015-09-21 DIAGNOSIS — Z1389 Encounter for screening for other disorder: Secondary | ICD-10-CM | POA: Diagnosis not present

## 2015-09-21 DIAGNOSIS — M5380 Other specified dorsopathies, site unspecified: Secondary | ICD-10-CM | POA: Diagnosis not present

## 2015-09-21 DIAGNOSIS — Z0000 Encounter for general adult medical examination without abnormal findings: Secondary | ICD-10-CM | POA: Diagnosis not present

## 2015-09-21 DIAGNOSIS — K649 Unspecified hemorrhoids: Secondary | ICD-10-CM | POA: Diagnosis not present

## 2015-09-21 DIAGNOSIS — I4891 Unspecified atrial fibrillation: Secondary | ICD-10-CM | POA: Diagnosis not present

## 2015-09-21 DIAGNOSIS — D7589 Other specified diseases of blood and blood-forming organs: Secondary | ICD-10-CM | POA: Diagnosis not present

## 2015-09-21 DIAGNOSIS — I509 Heart failure, unspecified: Secondary | ICD-10-CM | POA: Diagnosis not present

## 2015-09-21 DIAGNOSIS — I447 Left bundle-branch block, unspecified: Secondary | ICD-10-CM | POA: Diagnosis not present

## 2015-09-21 DIAGNOSIS — R945 Abnormal results of liver function studies: Secondary | ICD-10-CM | POA: Diagnosis not present

## 2015-09-21 DIAGNOSIS — M545 Low back pain: Secondary | ICD-10-CM | POA: Diagnosis not present

## 2015-09-21 DIAGNOSIS — I440 Atrioventricular block, first degree: Secondary | ICD-10-CM | POA: Diagnosis not present

## 2015-09-21 DIAGNOSIS — Z6826 Body mass index (BMI) 26.0-26.9, adult: Secondary | ICD-10-CM | POA: Diagnosis not present

## 2015-10-12 DIAGNOSIS — Z1231 Encounter for screening mammogram for malignant neoplasm of breast: Secondary | ICD-10-CM | POA: Diagnosis not present

## 2015-10-13 ENCOUNTER — Encounter: Payer: Self-pay | Admitting: Internal Medicine

## 2015-10-13 DIAGNOSIS — N8111 Cystocele, midline: Secondary | ICD-10-CM | POA: Diagnosis not present

## 2016-01-06 ENCOUNTER — Ambulatory Visit (HOSPITAL_COMMUNITY): Payer: Medicare Other | Attending: Internal Medicine

## 2016-01-06 ENCOUNTER — Other Ambulatory Visit: Payer: Self-pay

## 2016-01-06 DIAGNOSIS — I517 Cardiomegaly: Secondary | ICD-10-CM | POA: Insufficient documentation

## 2016-01-06 DIAGNOSIS — I340 Nonrheumatic mitral (valve) insufficiency: Secondary | ICD-10-CM | POA: Insufficient documentation

## 2016-01-06 DIAGNOSIS — I358 Other nonrheumatic aortic valve disorders: Secondary | ICD-10-CM | POA: Insufficient documentation

## 2016-01-06 DIAGNOSIS — I351 Nonrheumatic aortic (valve) insufficiency: Secondary | ICD-10-CM | POA: Diagnosis not present

## 2016-01-06 DIAGNOSIS — I5020 Unspecified systolic (congestive) heart failure: Secondary | ICD-10-CM | POA: Diagnosis not present

## 2016-01-06 DIAGNOSIS — I509 Heart failure, unspecified: Secondary | ICD-10-CM | POA: Diagnosis present

## 2016-01-06 DIAGNOSIS — I253 Aneurysm of heart: Secondary | ICD-10-CM | POA: Insufficient documentation

## 2016-01-06 DIAGNOSIS — I447 Left bundle-branch block, unspecified: Secondary | ICD-10-CM | POA: Insufficient documentation

## 2016-01-06 DIAGNOSIS — I059 Rheumatic mitral valve disease, unspecified: Secondary | ICD-10-CM | POA: Diagnosis not present

## 2016-01-06 DIAGNOSIS — I4891 Unspecified atrial fibrillation: Secondary | ICD-10-CM | POA: Diagnosis not present

## 2016-01-07 DIAGNOSIS — L821 Other seborrheic keratosis: Secondary | ICD-10-CM | POA: Diagnosis not present

## 2016-01-07 DIAGNOSIS — R21 Rash and other nonspecific skin eruption: Secondary | ICD-10-CM | POA: Diagnosis not present

## 2016-01-11 ENCOUNTER — Telehealth: Payer: Self-pay | Admitting: Internal Medicine

## 2016-01-11 NOTE — Telephone Encounter (Signed)
New Message  Pt call requesting to speak with RN about Echo result. Please call back to discuss

## 2016-01-11 NOTE — Telephone Encounter (Signed)
Patient informed of echo results.  She asked if she could get rid of some of her medications. Advised Dr. Harrington Challenger would like for her to stay on same medications; that they are beneficial to her heart. She is not having any trouble with breathing or any other symptoms. Per last OV note in 4/17, pt to follow with Dr. Joylene Draft  "Will be available as needed Leave up to pt and family".

## 2016-01-30 DIAGNOSIS — Z23 Encounter for immunization: Secondary | ICD-10-CM | POA: Diagnosis not present

## 2016-02-08 ENCOUNTER — Other Ambulatory Visit: Payer: Self-pay | Admitting: Nurse Practitioner

## 2016-02-10 DIAGNOSIS — L814 Other melanin hyperpigmentation: Secondary | ICD-10-CM | POA: Diagnosis not present

## 2016-02-10 DIAGNOSIS — D1801 Hemangioma of skin and subcutaneous tissue: Secondary | ICD-10-CM | POA: Diagnosis not present

## 2016-02-10 DIAGNOSIS — D2211 Melanocytic nevi of right eyelid, including canthus: Secondary | ICD-10-CM | POA: Diagnosis not present

## 2016-02-10 DIAGNOSIS — L298 Other pruritus: Secondary | ICD-10-CM | POA: Diagnosis not present

## 2016-02-24 DIAGNOSIS — L723 Sebaceous cyst: Secondary | ICD-10-CM | POA: Diagnosis not present

## 2016-02-24 DIAGNOSIS — H353132 Nonexudative age-related macular degeneration, bilateral, intermediate dry stage: Secondary | ICD-10-CM | POA: Diagnosis not present

## 2016-02-24 DIAGNOSIS — H04123 Dry eye syndrome of bilateral lacrimal glands: Secondary | ICD-10-CM | POA: Diagnosis not present

## 2016-02-24 DIAGNOSIS — H524 Presbyopia: Secondary | ICD-10-CM | POA: Diagnosis not present

## 2016-03-09 DIAGNOSIS — L281 Prurigo nodularis: Secondary | ICD-10-CM | POA: Diagnosis not present

## 2016-03-29 DIAGNOSIS — I4891 Unspecified atrial fibrillation: Secondary | ICD-10-CM | POA: Diagnosis not present

## 2016-03-29 DIAGNOSIS — Z6827 Body mass index (BMI) 27.0-27.9, adult: Secondary | ICD-10-CM | POA: Diagnosis not present

## 2016-03-29 DIAGNOSIS — I509 Heart failure, unspecified: Secondary | ICD-10-CM | POA: Diagnosis not present

## 2016-03-29 DIAGNOSIS — R350 Frequency of micturition: Secondary | ICD-10-CM | POA: Diagnosis not present

## 2016-03-29 DIAGNOSIS — N8189 Other female genital prolapse: Secondary | ICD-10-CM | POA: Diagnosis not present

## 2016-04-28 ENCOUNTER — Other Ambulatory Visit: Payer: Self-pay | Admitting: Specialist

## 2016-04-28 DIAGNOSIS — H04123 Dry eye syndrome of bilateral lacrimal glands: Secondary | ICD-10-CM | POA: Diagnosis not present

## 2016-04-28 DIAGNOSIS — L820 Inflamed seborrheic keratosis: Secondary | ICD-10-CM | POA: Diagnosis not present

## 2016-04-28 DIAGNOSIS — L723 Sebaceous cyst: Secondary | ICD-10-CM | POA: Diagnosis not present

## 2016-06-01 DIAGNOSIS — K5902 Outlet dysfunction constipation: Secondary | ICD-10-CM | POA: Diagnosis not present

## 2016-06-01 DIAGNOSIS — N8189 Other female genital prolapse: Secondary | ICD-10-CM | POA: Diagnosis not present

## 2016-10-12 DIAGNOSIS — Z1231 Encounter for screening mammogram for malignant neoplasm of breast: Secondary | ICD-10-CM | POA: Diagnosis not present

## 2016-10-19 DIAGNOSIS — M810 Age-related osteoporosis without current pathological fracture: Secondary | ICD-10-CM | POA: Diagnosis not present

## 2016-10-19 DIAGNOSIS — R350 Frequency of micturition: Secondary | ICD-10-CM | POA: Diagnosis not present

## 2016-10-19 DIAGNOSIS — E784 Other hyperlipidemia: Secondary | ICD-10-CM | POA: Diagnosis not present

## 2016-10-26 DIAGNOSIS — R413 Other amnesia: Secondary | ICD-10-CM | POA: Diagnosis not present

## 2016-10-26 DIAGNOSIS — M810 Age-related osteoporosis without current pathological fracture: Secondary | ICD-10-CM | POA: Diagnosis not present

## 2016-10-26 DIAGNOSIS — I447 Left bundle-branch block, unspecified: Secondary | ICD-10-CM | POA: Diagnosis not present

## 2016-10-26 DIAGNOSIS — Z0000 Encounter for general adult medical examination without abnormal findings: Secondary | ICD-10-CM | POA: Diagnosis not present

## 2016-10-26 DIAGNOSIS — D7589 Other specified diseases of blood and blood-forming organs: Secondary | ICD-10-CM | POA: Diagnosis not present

## 2016-10-26 DIAGNOSIS — Z1389 Encounter for screening for other disorder: Secondary | ICD-10-CM | POA: Diagnosis not present

## 2016-10-26 DIAGNOSIS — Z6828 Body mass index (BMI) 28.0-28.9, adult: Secondary | ICD-10-CM | POA: Diagnosis not present

## 2016-10-26 DIAGNOSIS — I509 Heart failure, unspecified: Secondary | ICD-10-CM | POA: Diagnosis not present

## 2016-10-26 DIAGNOSIS — K649 Unspecified hemorrhoids: Secondary | ICD-10-CM | POA: Diagnosis not present

## 2016-10-26 DIAGNOSIS — Z1231 Encounter for screening mammogram for malignant neoplasm of breast: Secondary | ICD-10-CM | POA: Diagnosis not present

## 2016-10-26 DIAGNOSIS — I4891 Unspecified atrial fibrillation: Secondary | ICD-10-CM | POA: Diagnosis not present

## 2016-10-26 DIAGNOSIS — I440 Atrioventricular block, first degree: Secondary | ICD-10-CM | POA: Diagnosis not present

## 2016-12-05 DIAGNOSIS — K648 Other hemorrhoids: Secondary | ICD-10-CM | POA: Diagnosis not present

## 2017-02-16 DIAGNOSIS — M545 Low back pain: Secondary | ICD-10-CM | POA: Diagnosis not present

## 2017-02-16 DIAGNOSIS — M6281 Muscle weakness (generalized): Secondary | ICD-10-CM | POA: Diagnosis not present

## 2017-02-16 DIAGNOSIS — M546 Pain in thoracic spine: Secondary | ICD-10-CM | POA: Diagnosis not present

## 2017-02-16 DIAGNOSIS — K649 Unspecified hemorrhoids: Secondary | ICD-10-CM | POA: Diagnosis not present

## 2017-02-27 DIAGNOSIS — Z23 Encounter for immunization: Secondary | ICD-10-CM | POA: Diagnosis not present

## 2017-05-26 DIAGNOSIS — I509 Heart failure, unspecified: Secondary | ICD-10-CM | POA: Diagnosis not present

## 2017-05-26 DIAGNOSIS — R413 Other amnesia: Secondary | ICD-10-CM | POA: Diagnosis not present

## 2017-05-26 DIAGNOSIS — I4891 Unspecified atrial fibrillation: Secondary | ICD-10-CM | POA: Diagnosis not present

## 2017-05-26 DIAGNOSIS — Z6828 Body mass index (BMI) 28.0-28.9, adult: Secondary | ICD-10-CM | POA: Diagnosis not present

## 2017-05-26 DIAGNOSIS — Z1389 Encounter for screening for other disorder: Secondary | ICD-10-CM | POA: Diagnosis not present

## 2017-05-26 DIAGNOSIS — K649 Unspecified hemorrhoids: Secondary | ICD-10-CM | POA: Diagnosis not present

## 2017-06-14 DIAGNOSIS — K5902 Outlet dysfunction constipation: Secondary | ICD-10-CM | POA: Diagnosis not present

## 2017-08-10 DIAGNOSIS — H353131 Nonexudative age-related macular degeneration, bilateral, early dry stage: Secondary | ICD-10-CM | POA: Diagnosis not present

## 2017-08-10 DIAGNOSIS — H524 Presbyopia: Secondary | ICD-10-CM | POA: Diagnosis not present

## 2017-08-10 DIAGNOSIS — H04123 Dry eye syndrome of bilateral lacrimal glands: Secondary | ICD-10-CM | POA: Diagnosis not present

## 2017-10-13 DIAGNOSIS — Z1231 Encounter for screening mammogram for malignant neoplasm of breast: Secondary | ICD-10-CM | POA: Diagnosis not present

## 2017-10-31 DIAGNOSIS — Z961 Presence of intraocular lens: Secondary | ICD-10-CM | POA: Diagnosis not present

## 2017-10-31 DIAGNOSIS — D23112 Other benign neoplasm of skin of right lower eyelid, including canthus: Secondary | ICD-10-CM | POA: Diagnosis not present

## 2017-12-14 DIAGNOSIS — M810 Age-related osteoporosis without current pathological fracture: Secondary | ICD-10-CM | POA: Diagnosis not present

## 2017-12-14 DIAGNOSIS — E7849 Other hyperlipidemia: Secondary | ICD-10-CM | POA: Diagnosis not present

## 2017-12-14 DIAGNOSIS — R82998 Other abnormal findings in urine: Secondary | ICD-10-CM | POA: Diagnosis not present

## 2017-12-22 DIAGNOSIS — R350 Frequency of micturition: Secondary | ICD-10-CM | POA: Diagnosis not present

## 2017-12-22 DIAGNOSIS — N8189 Other female genital prolapse: Secondary | ICD-10-CM | POA: Diagnosis not present

## 2017-12-22 DIAGNOSIS — I509 Heart failure, unspecified: Secondary | ICD-10-CM | POA: Diagnosis not present

## 2017-12-22 DIAGNOSIS — Z6828 Body mass index (BMI) 28.0-28.9, adult: Secondary | ICD-10-CM | POA: Diagnosis not present

## 2017-12-22 DIAGNOSIS — M810 Age-related osteoporosis without current pathological fracture: Secondary | ICD-10-CM | POA: Diagnosis not present

## 2017-12-22 DIAGNOSIS — Z0000 Encounter for general adult medical examination without abnormal findings: Secondary | ICD-10-CM | POA: Diagnosis not present

## 2017-12-22 DIAGNOSIS — R413 Other amnesia: Secondary | ICD-10-CM | POA: Diagnosis not present

## 2017-12-22 DIAGNOSIS — I4891 Unspecified atrial fibrillation: Secondary | ICD-10-CM | POA: Diagnosis not present

## 2017-12-22 DIAGNOSIS — I440 Atrioventricular block, first degree: Secondary | ICD-10-CM | POA: Diagnosis not present

## 2017-12-22 DIAGNOSIS — D7589 Other specified diseases of blood and blood-forming organs: Secondary | ICD-10-CM | POA: Diagnosis not present

## 2017-12-22 DIAGNOSIS — Z1389 Encounter for screening for other disorder: Secondary | ICD-10-CM | POA: Diagnosis not present

## 2017-12-22 DIAGNOSIS — I498 Other specified cardiac arrhythmias: Secondary | ICD-10-CM | POA: Diagnosis not present

## 2017-12-22 DIAGNOSIS — Z23 Encounter for immunization: Secondary | ICD-10-CM | POA: Diagnosis not present

## 2017-12-27 DIAGNOSIS — C441022 Unspecified malignant neoplasm of skin of right lower eyelid, including canthus: Secondary | ICD-10-CM | POA: Diagnosis not present

## 2017-12-27 DIAGNOSIS — H353131 Nonexudative age-related macular degeneration, bilateral, early dry stage: Secondary | ICD-10-CM | POA: Diagnosis not present

## 2018-01-02 DIAGNOSIS — Z1212 Encounter for screening for malignant neoplasm of rectum: Secondary | ICD-10-CM | POA: Diagnosis not present

## 2018-02-20 DIAGNOSIS — D485 Neoplasm of uncertain behavior of skin: Secondary | ICD-10-CM | POA: Diagnosis not present

## 2018-02-20 DIAGNOSIS — C441122 Basal cell carcinoma of skin of right lower eyelid, including canthus: Secondary | ICD-10-CM | POA: Diagnosis not present

## 2018-04-04 DIAGNOSIS — I509 Heart failure, unspecified: Secondary | ICD-10-CM | POA: Diagnosis not present

## 2018-04-04 DIAGNOSIS — R413 Other amnesia: Secondary | ICD-10-CM | POA: Diagnosis not present

## 2018-04-04 DIAGNOSIS — K649 Unspecified hemorrhoids: Secondary | ICD-10-CM | POA: Diagnosis not present

## 2018-04-04 DIAGNOSIS — M810 Age-related osteoporosis without current pathological fracture: Secondary | ICD-10-CM | POA: Diagnosis not present

## 2018-04-04 DIAGNOSIS — I4891 Unspecified atrial fibrillation: Secondary | ICD-10-CM | POA: Diagnosis not present

## 2018-04-04 DIAGNOSIS — Z6827 Body mass index (BMI) 27.0-27.9, adult: Secondary | ICD-10-CM | POA: Diagnosis not present

## 2018-05-29 DIAGNOSIS — R42 Dizziness and giddiness: Secondary | ICD-10-CM | POA: Diagnosis not present

## 2018-05-29 DIAGNOSIS — Z6828 Body mass index (BMI) 28.0-28.9, adult: Secondary | ICD-10-CM | POA: Diagnosis not present

## 2018-05-29 DIAGNOSIS — R05 Cough: Secondary | ICD-10-CM | POA: Diagnosis not present

## 2018-05-29 DIAGNOSIS — J189 Pneumonia, unspecified organism: Secondary | ICD-10-CM | POA: Diagnosis not present

## 2018-06-26 DIAGNOSIS — C441122 Basal cell carcinoma of skin of right lower eyelid, including canthus: Secondary | ICD-10-CM | POA: Diagnosis not present

## 2018-09-05 DIAGNOSIS — R030 Elevated blood-pressure reading, without diagnosis of hypertension: Secondary | ICD-10-CM | POA: Diagnosis not present

## 2018-09-05 DIAGNOSIS — R945 Abnormal results of liver function studies: Secondary | ICD-10-CM | POA: Diagnosis not present

## 2018-09-05 DIAGNOSIS — I4891 Unspecified atrial fibrillation: Secondary | ICD-10-CM | POA: Diagnosis not present

## 2018-09-05 DIAGNOSIS — R413 Other amnesia: Secondary | ICD-10-CM | POA: Diagnosis not present

## 2018-09-05 DIAGNOSIS — I509 Heart failure, unspecified: Secondary | ICD-10-CM | POA: Diagnosis not present

## 2018-09-05 DIAGNOSIS — M810 Age-related osteoporosis without current pathological fracture: Secondary | ICD-10-CM | POA: Diagnosis not present

## 2018-09-05 DIAGNOSIS — J189 Pneumonia, unspecified organism: Secondary | ICD-10-CM | POA: Diagnosis not present

## 2019-01-28 DIAGNOSIS — E7849 Other hyperlipidemia: Secondary | ICD-10-CM | POA: Diagnosis not present

## 2019-01-28 DIAGNOSIS — Z23 Encounter for immunization: Secondary | ICD-10-CM | POA: Diagnosis not present

## 2019-01-28 DIAGNOSIS — M810 Age-related osteoporosis without current pathological fracture: Secondary | ICD-10-CM | POA: Diagnosis not present

## 2019-02-12 DIAGNOSIS — R945 Abnormal results of liver function studies: Secondary | ICD-10-CM | POA: Diagnosis not present

## 2019-02-12 DIAGNOSIS — I4891 Unspecified atrial fibrillation: Secondary | ICD-10-CM | POA: Diagnosis not present

## 2019-02-12 DIAGNOSIS — Z1331 Encounter for screening for depression: Secondary | ICD-10-CM | POA: Diagnosis not present

## 2019-02-12 DIAGNOSIS — R42 Dizziness and giddiness: Secondary | ICD-10-CM | POA: Diagnosis not present

## 2019-02-12 DIAGNOSIS — R350 Frequency of micturition: Secondary | ICD-10-CM | POA: Diagnosis not present

## 2019-02-12 DIAGNOSIS — K921 Melena: Secondary | ICD-10-CM | POA: Diagnosis not present

## 2019-02-12 DIAGNOSIS — M810 Age-related osteoporosis without current pathological fracture: Secondary | ICD-10-CM | POA: Diagnosis not present

## 2019-02-12 DIAGNOSIS — Z0000 Encounter for general adult medical examination without abnormal findings: Secondary | ICD-10-CM | POA: Diagnosis not present

## 2019-02-12 DIAGNOSIS — I509 Heart failure, unspecified: Secondary | ICD-10-CM | POA: Diagnosis not present

## 2019-02-12 DIAGNOSIS — R413 Other amnesia: Secondary | ICD-10-CM | POA: Diagnosis not present

## 2019-02-12 DIAGNOSIS — N8189 Other female genital prolapse: Secondary | ICD-10-CM | POA: Diagnosis not present

## 2019-02-12 DIAGNOSIS — D7589 Other specified diseases of blood and blood-forming organs: Secondary | ICD-10-CM | POA: Diagnosis not present

## 2019-02-12 DIAGNOSIS — I440 Atrioventricular block, first degree: Secondary | ICD-10-CM | POA: Diagnosis not present

## 2019-03-08 DIAGNOSIS — M2041 Other hammer toe(s) (acquired), right foot: Secondary | ICD-10-CM | POA: Diagnosis not present

## 2019-03-08 DIAGNOSIS — L602 Onychogryphosis: Secondary | ICD-10-CM | POA: Diagnosis not present

## 2019-03-08 DIAGNOSIS — M2042 Other hammer toe(s) (acquired), left foot: Secondary | ICD-10-CM | POA: Diagnosis not present

## 2019-05-06 DIAGNOSIS — Z23 Encounter for immunization: Secondary | ICD-10-CM | POA: Diagnosis not present

## 2019-06-03 DIAGNOSIS — Z23 Encounter for immunization: Secondary | ICD-10-CM | POA: Diagnosis not present

## 2019-07-09 DIAGNOSIS — K5900 Constipation, unspecified: Secondary | ICD-10-CM | POA: Diagnosis not present

## 2019-07-09 DIAGNOSIS — N8189 Other female genital prolapse: Secondary | ICD-10-CM | POA: Diagnosis not present

## 2019-07-09 DIAGNOSIS — R945 Abnormal results of liver function studies: Secondary | ICD-10-CM | POA: Diagnosis not present

## 2019-07-09 DIAGNOSIS — I4891 Unspecified atrial fibrillation: Secondary | ICD-10-CM | POA: Diagnosis not present

## 2019-07-09 DIAGNOSIS — M810 Age-related osteoporosis without current pathological fracture: Secondary | ICD-10-CM | POA: Diagnosis not present

## 2019-07-09 DIAGNOSIS — F0390 Unspecified dementia without behavioral disturbance: Secondary | ICD-10-CM | POA: Diagnosis not present

## 2019-07-09 DIAGNOSIS — I509 Heart failure, unspecified: Secondary | ICD-10-CM | POA: Diagnosis not present

## 2019-07-09 DIAGNOSIS — K649 Unspecified hemorrhoids: Secondary | ICD-10-CM | POA: Diagnosis not present

## 2019-07-09 DIAGNOSIS — I447 Left bundle-branch block, unspecified: Secondary | ICD-10-CM | POA: Diagnosis not present

## 2019-07-09 DIAGNOSIS — E785 Hyperlipidemia, unspecified: Secondary | ICD-10-CM | POA: Diagnosis not present

## 2019-07-09 DIAGNOSIS — D6869 Other thrombophilia: Secondary | ICD-10-CM | POA: Diagnosis not present

## 2019-07-09 DIAGNOSIS — I440 Atrioventricular block, first degree: Secondary | ICD-10-CM | POA: Diagnosis not present

## 2019-11-02 ENCOUNTER — Other Ambulatory Visit: Payer: Self-pay

## 2019-11-02 ENCOUNTER — Encounter (HOSPITAL_COMMUNITY): Payer: Self-pay | Admitting: Obstetrics and Gynecology

## 2019-11-02 ENCOUNTER — Emergency Department (HOSPITAL_COMMUNITY)
Admission: EM | Admit: 2019-11-02 | Discharge: 2019-11-02 | Disposition: A | Discharge status: Home / Self Care | Payer: Medicare Other | Attending: Emergency Medicine | Admitting: Emergency Medicine

## 2019-11-02 DIAGNOSIS — I5020 Unspecified systolic (congestive) heart failure: Secondary | ICD-10-CM | POA: Diagnosis not present

## 2019-11-02 DIAGNOSIS — E860 Dehydration: Secondary | ICD-10-CM | POA: Diagnosis not present

## 2019-11-02 DIAGNOSIS — Z79899 Other long term (current) drug therapy: Secondary | ICD-10-CM | POA: Insufficient documentation

## 2019-11-02 DIAGNOSIS — R627 Adult failure to thrive: Secondary | ICD-10-CM | POA: Diagnosis present

## 2019-11-02 DIAGNOSIS — Z87891 Personal history of nicotine dependence: Secondary | ICD-10-CM | POA: Insufficient documentation

## 2019-11-02 DIAGNOSIS — Z7901 Long term (current) use of anticoagulants: Secondary | ICD-10-CM | POA: Insufficient documentation

## 2019-11-02 DIAGNOSIS — I25810 Atherosclerosis of coronary artery bypass graft(s) without angina pectoris: Secondary | ICD-10-CM | POA: Diagnosis not present

## 2019-11-02 DIAGNOSIS — N390 Urinary tract infection, site not specified: Secondary | ICD-10-CM

## 2019-11-02 LAB — URINALYSIS, ROUTINE W REFLEX MICROSCOPIC
Bilirubin Urine: NEGATIVE
Glucose, UA: NEGATIVE mg/dL
Ketones, ur: 5 mg/dL — AB
Nitrite: NEGATIVE
Protein, ur: 100 mg/dL — AB
Specific Gravity, Urine: 1.029 (ref 1.005–1.030)
pH: 5 (ref 5.0–8.0)

## 2019-11-02 LAB — BASIC METABOLIC PANEL
Anion gap: 12 (ref 5–15)
BUN: 35 mg/dL — ABNORMAL HIGH (ref 8–23)
CO2: 22 mmol/L (ref 22–32)
Calcium: 8.8 mg/dL — ABNORMAL LOW (ref 8.9–10.3)
Chloride: 99 mmol/L (ref 98–111)
Creatinine, Ser: 0.92 mg/dL (ref 0.44–1.00)
GFR calc Af Amer: >60 mL/min (ref >60)
GFR calc non Af Amer: 55 mL/min — ABNORMAL LOW (ref >60)
Glucose, Bld: 134 mg/dL — ABNORMAL HIGH (ref 70–99)
Potassium: 4.9 mmol/L (ref 3.5–5.1)
Sodium: 133 mmol/L — ABNORMAL LOW (ref 135–145)

## 2019-11-02 LAB — CBC WITH DIFFERENTIAL/PLATELET
Abs Immature Granulocytes: 0.03 10*3/uL (ref 0.00–0.07)
Basophils Absolute: 0 10*3/uL (ref 0.0–0.1)
Basophils Relative: 0 %
Eosinophils Absolute: 0.1 10*3/uL (ref 0.0–0.5)
Eosinophils Relative: 1 %
HCT: 47 % — ABNORMAL HIGH (ref 36.0–46.0)
Hemoglobin: 15.7 g/dL — ABNORMAL HIGH (ref 12.0–15.0)
Immature Granulocytes: 0 %
Lymphocytes Relative: 12 %
Lymphs Abs: 1.2 10*3/uL (ref 0.7–4.0)
MCH: 32.4 pg (ref 26.0–34.0)
MCHC: 33.4 g/dL (ref 30.0–36.0)
MCV: 97.1 fL (ref 80.0–100.0)
Monocytes Absolute: 0.9 10*3/uL (ref 0.1–1.0)
Monocytes Relative: 9 %
Neutro Abs: 7.6 10*3/uL (ref 1.7–7.7)
Neutrophils Relative %: 78 %
Platelets: 190 10*3/uL (ref 150–400)
RBC: 4.84 MIL/uL (ref 3.87–5.11)
RDW: 13.6 % (ref 11.5–15.5)
WBC: 9.8 10*3/uL (ref 4.0–10.5)
nRBC: 0 % (ref 0.0–0.2)

## 2019-11-02 MED ORDER — SODIUM CHLORIDE 0.9 % IV SOLN: INTRAVENOUS | Status: DC

## 2019-11-02 MED ORDER — CARVEDILOL 3.125 MG PO TABS
6.2500 mg | ORAL_TABLET | Freq: Two times a day (BID) | ORAL | Status: DC
  Administered 2019-11-02: 6.25 mg via ORAL
  Filled 2019-11-02: qty 2

## 2019-11-02 MED ORDER — SODIUM CHLORIDE 0.9 % IV SOLN
1.0000 g | INTRAVENOUS | Status: DC
  Administered 2019-11-02: 1 g via INTRAVENOUS
  Filled 2019-11-02: qty 10

## 2019-11-02 MED ORDER — SODIUM CHLORIDE 0.9 % IV BOLUS
1000.0000 mL | Freq: Once | INTRAVENOUS | Status: AC
  Administered 2019-11-02: 1000 mL via INTRAVENOUS

## 2019-11-02 MED ORDER — CEPHALEXIN 500 MG PO CAPS
500.0000 mg | ORAL_CAPSULE | Freq: Three times a day (TID) | ORAL | 0 refills | Status: DC
Start: 2019-11-02 — End: 2019-11-07

## 2019-11-02 NOTE — ED Provider Notes (Signed)
[facility] DEPT Provider Note   CSN: [id number] Arrival date & time: 11/02/19  4098     History Chief Complaint  Patient presents with  . Failure To Thrive    Darlene Collins is a 86 y.o. female.  86 year old female who presents from nursing home with possible altered mental status.  Patient does have a history of A. fib which is chronic in nature and she is on Eliquis for this as well as dementia.  Patient herself states that she has no somatic complaints.  Denies any chest pain or shortness of breath.  She is alert and oriented x4.  No history of trauma recently.  There is some concern that some the symptoms could be from being moved from one room to another in the facility where she stays.  No recent changes to her medications.  She is a DNR        Past Medical History:  Diagnosis Date  . Abdominal pain, lower 06/20/2015  . Atrial fibrillation ([place])   . CAD (coronary artery disease) of artery bypass graft 06/25/2015  . Chronic anticoagulation   . Chronic cough 11/03/2011  . Debility 06/25/2015  . Dyspnea 06/25/2015  . Edema of both legs 06/25/2015   07/07/15 BNP 363.7   . Hemorrhoids   . High cholesterol   . Hypokalemia 07/01/2015   07/07/15 K 4.8   . Hyponatremia 06/25/2015   07/07/15 Na 134   . IBS (irritable bowel syndrome) 06/25/2015  . LBBB (left bundle branch block)   . Nonsustained ventricular tachycardia ([place]) 06/25/2015  . Pneumonia 06/25/2015  . Systolic CHF ([place]) 05/20/1476   07/07/15 BNP 363.7   . Systolic heart failure ([place])   . Weakness generalized 06/25/2015    Patient Active Problem List   Diagnosis Date Noted  . Hypokalemia 07/01/2015  . Hyponatremia 06/25/2015  . Atrial fibrillation ([place]) 06/25/2015  . Systolic CHF ([place]) 29/56/2130  . CAD (coronary artery disease) of artery bypass graft 06/25/2015  . LBBB (left bundle branch block) 06/25/2015  . Nonsustained ventricular tachycardia ([place]) 06/25/2015  . Dyspnea 06/25/2015  . IBS  (irritable bowel syndrome) 06/25/2015  . Pneumonia 06/25/2015  . Weakness generalized 06/25/2015  . Edema of both legs 06/25/2015  . Debility 06/25/2015  . Abdominal pain, lower 06/20/2015  . Chronic cough 11/03/2011    Past Surgical History:  Procedure Laterality Date  . cataracts    . TONSILLECTOMY AND ADENOIDECTOMY       OB History   No obstetric history on file.     Family History  Problem Relation Age of Onset  . Heart attack Father     Social History   Tobacco Use  . Smoking status: Former Smoker    Packs/day: 1.00    Years: 25.00    Pack years: 25.00    Types: Cigarettes    Quit date: 10/30/1976    Years since quitting: 43.0  . Smokeless tobacco: Never Used  Substance Use Topics  . Alcohol use: Yes    Comment: wine  . Drug use: No    Home Medications Prior to Admission medications   Medication Sig Start Date End Date Taking? Authorizing Provider  apixaban (ELIQUIS) 5 MG TABS tablet Take 5 mg by mouth 2 (two) times daily.    [provider]  carvedilol (COREG) 6.25 MG tablet Take 6.25 mg by mouth 2 (two) times daily with a meal.    [provider]  furosemide (LASIX) 40 MG tablet TAKE 1  TABLET ONCE DAILY. 02/09/16   Burtis Junes, NP  Multiple Vitamins-Minerals (CENTRUM SILVER PO) Take 1 tablet by mouth daily.     [provider]  naproxen sodium (ANAPROX) 220 MG tablet Take 220 mg by mouth as needed.    [provider]  potassium chloride SA (K-DUR,KLOR-CON) 20 MEQ tablet Take 1 tablet (20 mEq total) by mouth daily. 07/22/15   Burtis Junes, NP  spironolactone (ALDACTONE) 25 MG tablet Take 25 mg by mouth daily.    [provider]  Vitamin D, Ergocalciferol, (DRISDOL) 50000 UNITS CAPS Take 50,000 Units by mouth every 7 (seven) days.  09/19/11   [provider]    Allergies    Aspirin, Penicillins, and Sulfa antibiotics  Review of Systems   Review of Systems  All other systems reviewed and are  negative.   Physical Exam Updated Vital Signs There were no vitals taken for this visit.  Physical Exam Vitals and nursing note reviewed.  Constitutional:      General: She is not in acute distress.    Appearance: Normal appearance. She is well-developed. She is not toxic-appearing.  HENT:     Head: Normocephalic and atraumatic.  Eyes:     General: Lids are normal.     Conjunctiva/sclera: Conjunctivae normal.     Pupils: Pupils are equal, round, and reactive to light.  Neck:     Thyroid: No thyroid mass.     Trachea: No tracheal deviation.  Cardiovascular:     Rate and Rhythm: Normal rate. Rhythm irregular.     Heart sounds: Normal heart sounds. No murmur heard.  No gallop.   Pulmonary:     Effort: Pulmonary effort is normal. No respiratory distress.     Breath sounds: Normal breath sounds. No stridor. No decreased breath sounds, wheezing, rhonchi or rales.  Abdominal:     General: Bowel sounds are normal. There is no distension.     Palpations: Abdomen is soft.     Tenderness: There is no abdominal tenderness. There is no rebound.  Musculoskeletal:        General: No tenderness. Normal range of motion.     Cervical back: Normal range of motion and neck supple.  Skin:    General: Skin is warm and dry.     Findings: No abrasion or rash.  Neurological:     General: No focal deficit present.     Mental Status: She is alert and oriented to person, place, and time.     GCS: GCS eye subscore is 4. GCS verbal subscore is 5. GCS motor subscore is 6.     Cranial Nerves: No cranial nerve deficit.     Sensory: No sensory deficit.     Comments: Strength is 5 of 5 in upper as well as lower extremities  Psychiatric:        Attention and Perception: Attention normal.        Mood and Affect: Mood normal.        Speech: Speech normal.        Behavior: Behavior normal.     ED Results / Procedures / Treatments   Labs (all labs ordered are listed, but only abnormal results are  displayed) Labs Reviewed  URINE CULTURE  CBC WITH DIFFERENTIAL/PLATELET  BASIC METABOLIC PANEL  URINALYSIS, ROUTINE W REFLEX MICROSCOPIC    EKG EKG Interpretation  Date/Time:  Saturday November 02 2019 09:34:54 EDT Ventricular Rate:  112 PR Interval:    QRS Duration: 145  QT Interval:  378 QTC Calculation: 516 R Axis:   -66 Text Interpretation: Atrial fibrillation Left bundle branch block Confirmed by Lacretia Leigh ([id number]) on 11/02/2019 10:10:31 AM   Radiology No results found.  Procedures Procedures (including critical care time)  Medications Ordered in ED Medications  0.9 %  sodium chloride infusion (has no administration in time range)    ED Course  I have reviewed the triage vital signs and the nursing notes.  Pertinent labs & imaging results that were available during my care of the patient were reviewed by me and considered in my medical decision making (see chart for details).    MDM Rules/Calculators/A&P                          Patient has history of chronic A. fib and is on Eliquis.  She did not take her a.m. beta-blocker and that was given here.  Also based on her laboratory studies it looks like she has mild dehydration and her urinalysis is consistent with infection.  This is also likely contributing to her increased heart rate.  Was given IV hydration here as well as IV antibiotics.  She is here with her daughter and feels that patient is at her baseline and will prescribe patient course of Keflex and have her follow-up with her doctor Final Clinical Impression(s) / ED Diagnoses Final diagnoses:  None    Rx / DC Orders ED Discharge Orders    None       Lacretia Leigh, MD 11/02/19 1322

## 2019-11-02 NOTE — ED Triage Notes (Signed)
Patient presents to the ER from [facility]. Patient has a hx of Afib and dementia. Patient reportedly has been declining for the past 4 days since she had to "move wings" at [facility]

## 2019-11-03 LAB — URINE CULTURE: Culture: NO GROWTH

## 2019-11-06 ENCOUNTER — Encounter: Payer: Self-pay | Admitting: Nurse Practitioner

## 2019-11-06 ENCOUNTER — Non-Acute Institutional Stay: Payer: Medicare Other | Admitting: Nurse Practitioner

## 2019-11-06 DIAGNOSIS — I5022 Chronic systolic (congestive) heart failure: Secondary | ICD-10-CM | POA: Diagnosis not present

## 2019-11-06 DIAGNOSIS — R4189 Other symptoms and signs involving cognitive functions and awareness: Secondary | ICD-10-CM

## 2019-11-06 DIAGNOSIS — R5381 Other malaise: Secondary | ICD-10-CM | POA: Diagnosis not present

## 2019-11-06 DIAGNOSIS — R748 Abnormal levels of other serum enzymes: Secondary | ICD-10-CM

## 2019-11-06 DIAGNOSIS — I480 Paroxysmal atrial fibrillation: Secondary | ICD-10-CM | POA: Diagnosis not present

## 2019-11-06 DIAGNOSIS — E785 Hyperlipidemia, unspecified: Secondary | ICD-10-CM | POA: Insufficient documentation

## 2019-11-06 DIAGNOSIS — N390 Urinary tract infection, site not specified: Secondary | ICD-10-CM | POA: Insufficient documentation

## 2019-11-06 DIAGNOSIS — E871 Hypo-osmolality and hyponatremia: Secondary | ICD-10-CM | POA: Diagnosis not present

## 2019-11-06 DIAGNOSIS — I10 Essential (primary) hypertension: Secondary | ICD-10-CM | POA: Diagnosis not present

## 2019-11-06 LAB — LIPID PANEL
Cholesterol: 157 (ref 0–200)
HDL: 20 — AB (ref 35–70)
LDL Cholesterol: 116
LDl/HDL Ratio: 7.9
Triglycerides: 107 (ref 40–160)

## 2019-11-06 LAB — CBC AND DIFFERENTIAL
HCT: 47 — AB (ref 36–46)
Hemoglobin: 46.9 — AB (ref 12.0–16.0)
Neutrophils Absolute: 6908
Platelets: 180 (ref 150–399)
WBC: 8.8

## 2019-11-06 LAB — HEPATIC FUNCTION PANEL
ALT: 176 — AB (ref 7–35)
AST: 84 — AB (ref 13–35)
Alkaline Phosphatase: 92 (ref 25–125)
Bilirubin, Total: 0.8

## 2019-11-06 LAB — BASIC METABOLIC PANEL
BUN: 35 — AB (ref 4–21)
CO2: 22 (ref 13–22)
Chloride: 99 (ref 99–108)
Creatinine: 0.8 (ref 0.5–1.1)
Glucose: 164
Potassium: 4.8 (ref 3.4–5.3)
Sodium: 129 — AB (ref 137–147)

## 2019-11-06 LAB — COMPREHENSIVE METABOLIC PANEL
Albumin: 3.2 — AB (ref 3.5–5.0)
Calcium: 8.9 (ref 8.7–10.7)
Globulin: 1.9

## 2019-11-06 LAB — CBC: RBC: 4.87 (ref 3.87–5.11)

## 2019-11-06 LAB — TSH: TSH: 4.64 (ref 0.41–5.90)

## 2019-11-06 NOTE — Assessment & Plan Note (Signed)
Compensated presently, off Furosemide.

## 2019-11-06 NOTE — Assessment & Plan Note (Addendum)
Urine culture showed no growth, dc Keflex, observe for s/s of UTI, update CBC/diff.

## 2019-11-06 NOTE — Assessment & Plan Note (Signed)
Will obtain lipid panel, continue Rosuvastatin 5mg  qd.

## 2019-11-06 NOTE — Assessment & Plan Note (Addendum)
Mild, Na 133, will repeat CMP/eGFR 11/06/19 Na 129, K 4.8, Bun 35, creat 0.79, eGFR 66, LDL 116, AST 84, ALT 176, wbc 8.8, Hgb 15.5, plt 180, neutrophils 78.5 Encourage oral fluid intake, repeat BMP/eGFR one week.

## 2019-11-06 NOTE — Assessment & Plan Note (Signed)
FTT, needs higher level of care at [facility]

## 2019-11-06 NOTE — Assessment & Plan Note (Addendum)
Heart rate 110-120 bpm, denied dizziness, change of vision, chest pian/pressrue, palpitation, SOB, nausea, vomiting, or constipation, continue Eliquis, Carvedilol. EKG Afib, vent rate 112 11/02/19. Add Diltiazem 120mg  qd. Observe. Obtain TSH

## 2019-11-06 NOTE — Progress Notes (Addendum)
Location:    [facility] Room Number: 299 MEQAS of Service:  ALF [phone]) Provider: Marlana Latus NP  Crist Infante, MD  Patient Care Team: Crist Infante, MD as PCP - General (Internal Medicine)  Extended Emergency Contact Information Primary Emergency Contact: Hulsebus,Robert Address: [address] [address] [place] Phone: [phone] Mobile Phone: [phone] Relation: Spouse Secondary Emergency Contact: Alan Ripper Mobile Phone: [phone] Relation: Daughter  Code Status: Full Code Goals of care: Advanced Directive information Advanced Directives 11/02/2019  Does Patient Have a Medical Advance Directive? No  Type of Advance Directive -  Does patient want to make changes to medical advance directive? -  Copy of [place] in Chart? -  Would patient like information on creating a medical advance directive? No - Patient declined     Chief Complaint  Patient presents with  . Acute Visit    rapid heart rate, fatigue      HPI:  Pt is a 86 y.o. female seen today for an acute visit for generalized weakness, rapid heart beats, needs higher level of care for her ADLs  ED eval 11/02/19 for dehydration, FTT, UTI-urine culture showed no growth, takes Keflex.   Hx of Afib, heart rate 110-120s, denied dizziness, change of vision, chest pian/pressrue, palpitation, SOB, nausea, vomiting, or constipation, takes Eliquis, Carvedilol. EKG Afib, vent rate 112 11/02/19  Dementia, needs MMSE, higher level of care.   Mood, flat affect, takes Zyprexa, Depakote.   Hyperlipidemia, takes Rosuvastatin 42m qd.   Hyponatremia, Na 133 11/02/19     Past Medical History:  Diagnosis Date  . Abdominal pain, lower 06/20/2015  . Atrial [place]   . CAD (coronary artery disease) of artery bypass graft 06/25/2015  . Chronic anticoagulation   . Chronic cough 11/03/2011  . Debility 06/25/2015  . Dyspnea 06/25/2015  . Edema  of both legs 06/25/2015   07/07/15 BNP 363.7   . Hemorrhoids   . High cholesterol   . Hypokalemia 07/01/2015   07/07/15 K 4.8   . Hyponatremia 06/25/2015   07/07/15 Na 134   . IBS (irritable bowel syndrome) 06/25/2015  . LBBB (left bundle branch block)   . Nonsustained ventricular tachycardia ([place] 06/25/2015  . Pneumonia 06/25/2015  . Systolic CHF ([place] 22/63/7858  07/07/15 BNP 363.7   . Systolic heart failure ([place]   . Weakness generalized 06/25/2015   Past Surgical History:  Procedure Laterality Date  . cataracts    . TONSILLECTOMY AND ADENOIDECTOMY      Allergies  Allergen Reactions  . Aspirin     Makes her feel unpleasant  . Lidocaine Other (See Comments)    Reaction unknown  . Mevacor [Lovastatin] Other (See Comments)    Reaction unknown  . Penicillins Other (See Comments)    "Patient unable to remember reaction"  . Sulfa Antibiotics     " Patient unable to remember reaction"    Allergies as of 11/06/2019      Reactions   Aspirin    Makes her feel unpleasant   Lidocaine Other (See Comments)   Reaction unknown   Mevacor [lovastatin] Other (See Comments)   Reaction unknown   Penicillins Other (See Comments)   "Patient unable to remember reaction"   Sulfa Antibiotics    " Patient unable to remember reaction"      Medication List       Accurate as of November 06, 2019  1:52 PM. If you have any questions, ask your nurse or doctor.        STOP taking these medications   furosemide 40 MG tablet Commonly known as: LASIX Stopped by: Jaryn Hocutt X Gabino Hagin, NP   potassium chloride SA 20 MEQ tablet Commonly known as: KLOR-CON Stopped by: Johanan Skorupski X Keoni Risinger, NP     TAKE these medications   carvedilol 6.25 MG tablet Commonly known as: COREG Take 6.25 mg by mouth 2 (two) times daily with a meal.   CENTRUM SILVER PO Take 1 tablet by mouth daily.   cephALEXin 500 MG capsule Commonly known as: KEFLEX Take 1 capsule (500 mg total) by mouth 3 (three) times daily.   divalproex 125 MG  capsule Commonly known as: DEPAKOTE SPRINKLE Take 125 mg by mouth 2 (two) times daily.   Eliquis 5 MG Tabs tablet Generic drug: apixaban Take 5 mg by mouth 2 (two) times daily.   OLANZapine 5 MG tablet Commonly known as: ZYPREXA Take 5 mg by mouth at bedtime.   rosuvastatin 5 MG tablet Commonly known as: CRESTOR Take 5 mg by mouth daily.   VITAMIN D3 PO Take 1 tablet by mouth daily. Vitamin D3 200IU oral tablet       Review of Systems  Constitutional: Positive for activity change, appetite change and fatigue. Negative for chills, diaphoresis and fever.  HENT: Positive for hearing loss. Negative for congestion, trouble swallowing and voice change.   Eyes: Negative for visual disturbance.  Respiratory: Negative for cough, chest tightness and shortness of breath.   Cardiovascular: Negative for chest pain, palpitations and leg swelling.  Gastrointestinal: Negative for abdominal pain, constipation, diarrhea, nausea and vomiting.  Genitourinary: Negative for difficulty urinating, dysuria, frequency, hematuria and urgency.  Musculoskeletal: Positive for gait problem.  Skin: Negative for color change and pallor.  Neurological: Negative for dizziness, speech difficulty and headaches.  Psychiatric/Behavioral: Positive for dysphoric mood. Negative for agitation, behavioral problems, hallucinations and sleep disturbance. The patient is not nervous/anxious.     Immunization History  Administered Date(s) Administered  . Influenza Whole 01/01/2011   Pertinent  Health Maintenance Due  Topic Date Due  . DEXA SCAN  Never done  . PNA vac Low Risk Adult (1 of 2 - PCV13) Never done  . INFLUENZA VACCINE  12/01/2019   No flowsheet data found. Functional Status Survey:    Vitals:   11/06/19 1108  BP: 121/74  Pulse: (!) 110  Resp: 18  Temp: (!) 96.9 F (36.1 C)  SpO2: 93%  Weight: 146 lb (66.2 kg)  Height: _0  (1.6 m)   Body mass index is 25.86 kg/m. Physical Exam Vitals and  nursing note reviewed.  Constitutional:      General: She is not in acute distress.    Appearance: She is not ill-appearing, toxic-appearing or diaphoretic.     Comments: Appears tired.   HENT:     Head: Normocephalic and atraumatic.     Nose: Nose normal.     Mouth/Throat:     Mouth: Mucous membranes are dry.  Eyes:     Extraocular Movements: Extraocular movements intact.     Conjunctiva/sclera: Conjunctivae normal.     Pupils: Pupils are equal, round, and reactive to light.  Cardiovascular:     Rate and Rhythm: Tachycardia present. Rhythm irregular.     Pulses: Normal pulses.     Heart sounds: Normal heart sounds.  Pulmonary:     Effort: Pulmonary effort is normal.  Breath sounds: No wheezing, rhonchi or rales.  Abdominal:     General: Bowel sounds are normal.     Palpations: Abdomen is soft.     Tenderness: There is no abdominal tenderness. There is no right CVA tenderness, left CVA tenderness, guarding or rebound.  Musculoskeletal:     Cervical back: Normal range of motion and neck supple.     Right lower leg: No edema.     Left lower leg: No edema.  Skin:    General: Skin is warm and dry.  Neurological:     General: No focal deficit present.     Mental Status: She is alert and oriented to person, place, and time. Mental status is at baseline.  Psychiatric:        Thought Content: Thought content normal.        Judgment: Judgment normal.     Comments: Flat affect, but conversing appropriately.      Labs reviewed: Recent Labs    11/02/19 0930  NA 133*  K 4.9  CL 99  CO2 22  GLUCOSE 134*  BUN 35*  CREATININE 0.92  CALCIUM 8.8*   No results for input(s): AST, ALT, ALKPHOS, BILITOT, PROT, ALBUMIN in the last 8760 hours. Recent Labs    11/02/19 0930  WBC 9.8  NEUTROABS 7.6  HGB 15.7*  HCT 47.0*  MCV 97.1  PLT 190   Lab Results  Component Value Date   TSH 5.30 (H) 06/26/2015   No results found for: HGBA1C No results found for: CHOL, HDL, LDLCALC,  LDLDIRECT, TRIG, CHOLHDL  Significant Diagnostic Results in last 30 days:  No results found.  Assessment/Plan: Atrial fibrillation (HCC) Heart rate 110-120 bpm, denied dizziness, change of vision, chest pian/pressrue, palpitation, SOB, nausea, vomiting, or constipation, continue Eliquis, Carvedilol. EKG Afib, vent rate 112 11/02/19. Add Diltiazem 134m qd. Observe. Obtain TSH   Systolic CHF ([place] Compensated presently, off Furosemide.   Debility FTT[facility] Hyponatremia Mild, Na 133, will repeat CMP/eGFR 11/06/19 Na 129, K 4.8, Bun 35, creat 0.79, eGFR 66, LDL 116, AST 84, ALT 176, wbc 8.8, Hgb 15.5, plt 180, neutrophils 78.5 Encourage oral fluid intake, repeat BMP/eGFR one week.   UTI (urinary tract infection) Urine culture showed no growth, dc Keflex, observe for s/s of UTI, update CBC/diff.   Cognitive deficits The patient is alert, oriented x3, Hx of documentation indicated diagnosis of dementia, will obtain MMSE, continue Zyprexa, Depakote for mood for now.   Hyperlipidemia Will obtain lipid panel, continue Rosuvastatin 575mqd.   Liver enzyme elevation 11/06/19 AST 84, ALT 176, total bilirubin 0.8(0.2-1.2) alk phos 92(37/153. Repeat LFT in one week.     Family/ staff Communication: plan of care reviewed with the patient, the patient's daughter, HPOA, and charge nurse.   Labs/tests ordered: CBC/diff, CMP/eGFR, TSH, Lipid panel  Time spend 40 minutes.

## 2019-11-06 NOTE — Assessment & Plan Note (Signed)
The patient is alert, oriented x3, Hx of documentation indicated diagnosis of dementia, will obtain MMSE, continue Zyprexa, Depakote for mood for now.

## 2019-11-06 NOTE — Assessment & Plan Note (Signed)
11/06/19 AST 84, ALT 176, total bilirubin 0.8(0.2-1.2) alk phos 92(37/153. Repeat LFT in one week.

## 2019-11-07 ENCOUNTER — Encounter: Payer: Self-pay | Admitting: Nurse Practitioner

## 2019-11-07 ENCOUNTER — Other Ambulatory Visit: Payer: Self-pay

## 2019-11-07 ENCOUNTER — Encounter (HOSPITAL_COMMUNITY): Payer: Self-pay | Admitting: Physician Assistant

## 2019-11-07 ENCOUNTER — Ambulatory Visit (HOSPITAL_COMMUNITY)
Admission: RE | Admit: 2019-11-07 | Discharge: 2019-11-07 | Disposition: A | Discharge status: Home / Self Care | Payer: Medicare Other | Source: Ambulatory Visit | Attending: Physician Assistant | Admitting: Physician Assistant

## 2019-11-07 VITALS — BP 100/70 | HR 101 | Ht 63.0 in | Wt 145.9 lb

## 2019-11-07 DIAGNOSIS — I4819 Other persistent atrial fibrillation: Secondary | ICD-10-CM

## 2019-11-07 DIAGNOSIS — Z8701 Personal history of pneumonia (recurrent): Secondary | ICD-10-CM | POA: Insufficient documentation

## 2019-11-07 DIAGNOSIS — Z880 Allergy status to penicillin: Secondary | ICD-10-CM | POA: Diagnosis not present

## 2019-11-07 DIAGNOSIS — I25810 Atherosclerosis of coronary artery bypass graft(s) without angina pectoris: Secondary | ICD-10-CM | POA: Insufficient documentation

## 2019-11-07 DIAGNOSIS — Z882 Allergy status to sulfonamides status: Secondary | ICD-10-CM | POA: Diagnosis not present

## 2019-11-07 DIAGNOSIS — Z888 Allergy status to other drugs, medicaments and biological substances status: Secondary | ICD-10-CM | POA: Diagnosis not present

## 2019-11-07 DIAGNOSIS — D6869 Other thrombophilia: Secondary | ICD-10-CM | POA: Diagnosis not present

## 2019-11-07 DIAGNOSIS — Z884 Allergy status to anesthetic agent status: Secondary | ICD-10-CM | POA: Insufficient documentation

## 2019-11-07 DIAGNOSIS — Z79899 Other long term (current) drug therapy: Secondary | ICD-10-CM | POA: Diagnosis not present

## 2019-11-07 DIAGNOSIS — I447 Left bundle-branch block, unspecified: Secondary | ICD-10-CM | POA: Diagnosis not present

## 2019-11-07 DIAGNOSIS — Z7901 Long term (current) use of anticoagulants: Secondary | ICD-10-CM | POA: Insufficient documentation

## 2019-11-07 DIAGNOSIS — K589 Irritable bowel syndrome without diarrhea: Secondary | ICD-10-CM | POA: Diagnosis not present

## 2019-11-07 DIAGNOSIS — Z886 Allergy status to analgesic agent status: Secondary | ICD-10-CM | POA: Insufficient documentation

## 2019-11-07 DIAGNOSIS — I480 Paroxysmal atrial fibrillation: Secondary | ICD-10-CM | POA: Diagnosis present

## 2019-11-07 DIAGNOSIS — I5022 Chronic systolic (congestive) heart failure: Secondary | ICD-10-CM | POA: Insufficient documentation

## 2019-11-07 DIAGNOSIS — Z87891 Personal history of nicotine dependence: Secondary | ICD-10-CM | POA: Diagnosis not present

## 2019-11-07 NOTE — Progress Notes (Signed)
Primary Care Physician: Crist Infante, MD Primary Cardiologist: Dr Harrington Challenger Primary Electrophysiologist: none Referring Physician: Dr Perini/Dr Mast   Darlene Collins is a 86 y.o. female with a history of chronic systolic CHF and paroxysmal atrial fibrillation who presents for consultation in the [facility].  The patient was initially diagnosed with atrial fibrillation remotely and has been maintained on Coreg and Eliquis for a CHADS2VASC score of 4. Patient was seen in the ER on 11/02/19 for altered mental status. She was noted to be in rapid afib and her urinalysis was consistent with infection. She was started on antibiotics but her culture came back negative. On follow up with her PCP, she remained in afib with elevated heart rates and diltiazem was started. History is provided mostly by her daughter as the patient is a difficulty historian. Patient reports that she feels well overall but is more fatigued. She denies SOB or edema. Her heart rates per the daughter have been ~100-110 bpm. She restarted Eliquis on 10/31/19.  Today, she denies symptoms of palpitations, chest pain, shortness of breath, orthopnea, PND, lower extremity edema, dizziness, presyncope, syncope, snoring, daytime somnolence, bleeding, or neurologic sequela. The patient is tolerating medications without difficulties and is otherwise without complaint today.    Atrial Fibrillation Risk Factors:  she does not have symptoms or diagnosis of sleep apnea. she does not have a history of rheumatic fever.   she has a BMI of Body mass index is 25.85 kg/m.Marland Kitchen Filed Weights   11/07/19 1519  Weight: 66.2 kg    Family History  Problem Relation Age of Onset   Heart attack Father      Atrial Fibrillation Management history:  Previous antiarrhythmic drugs: none Previous cardioversions: none Previous ablations: none CHADS2VASC score: 4 Anticoagulation history: Eliquis   Past Medical History:    Diagnosis Date   Abdominal pain, lower 06/20/2015   Atrial fibrillation (HCC)    CAD (coronary artery disease) of artery bypass graft 06/25/2015   Chronic anticoagulation    Chronic cough 11/03/2011   Debility 06/25/2015   Dyspnea 06/25/2015   Edema of both legs 06/25/2015   07/07/15 BNP 363.7    Hemorrhoids    High cholesterol    Hypokalemia 07/01/2015   07/07/15 K 4.8    Hyponatremia 06/25/2015   07/07/15 Na 134    IBS (irritable bowel syndrome) 06/25/2015   LBBB (left bundle branch block)    Nonsustained ventricular tachycardia ([employer]) 06/25/2015   Pneumonia 11/11/4578   Systolic CHF ([place]) 9/98/3382   07/07/15 BNP 505.3    Systolic heart failure (HCC)    Weakness generalized 06/25/2015   Past Surgical History:  Procedure Laterality Date   cataracts     TONSILLECTOMY AND ADENOIDECTOMY      Current Outpatient Medications  Medication Sig Dispense Refill   apixaban (ELIQUIS) 5 MG TABS tablet Take 5 mg by mouth 2 (two) times daily.     carvedilol (COREG) 6.25 MG tablet Take 6.25 mg by mouth 2 (two) times daily with a meal.     Cholecalciferol (VITAMIN D3 PO) Take 1 tablet by mouth daily. Vitamin D3 200IU oral tablet     diltiazem (CARDIZEM CD) 120 MG 24 hr capsule Take 120 mg by mouth daily.     divalproex (DEPAKOTE SPRINKLE) 125 MG capsule Take 125 mg by mouth 2 (two) times daily.     Multiple Vitamins-Minerals (CENTRUM SILVER PO) Take 1 tablet by mouth daily.      OLANZapine (ZYPREXA)  5 MG tablet Take 5 mg by mouth at bedtime.     rosuvastatin (CRESTOR) 5 MG tablet Take 5 mg by mouth daily.     No current facility-administered medications for this encounter.    Allergies  Allergen Reactions   Aspirin     Makes her feel unpleasant   Lidocaine Other (See Comments)    Reaction unknown   Mevacor [Lovastatin] Other (See Comments)    Reaction unknown   Penicillins Other (See Comments)    "Patient unable to remember reaction"   Sulfa Antibiotics     "  Patient unable to remember reaction"    Social History   Socioeconomic History   Marital status: Married    Spouse name: Not on file   Number of children: 2   Years of education: Not on file   Highest education level: Not on file  Occupational History   Not on file  Tobacco Use   Smoking status: Former Smoker    Packs/day: 1.00    Years: 25.00    Pack years: 25.00    Types: Cigarettes    Quit date: 10/30/1976    Years since quitting: 43.0   Smokeless tobacco: Never Used  Substance and Sexual Activity   Alcohol use: Not Currently    Comment: wine   Drug use: No   Sexual activity: Not on file  Other Topics Concern   Not on file  Social History Narrative   Not on file   Social Determinants of Health   Financial Resource Strain:    Difficulty of Paying Living Expenses:   Food Insecurity:    Worried About Charity fundraiser in the Last Year:    Arboriculturist in the Last Year:   Transportation Needs:    Film/video editor (Medical):    Lack of Transportation (Non-Medical):   Physical Activity:    Days of Exercise per Week:    Minutes of Exercise per Session:   Stress:    Feeling of Stress :   Social Connections:    Frequency of Communication with Friends and Family:    Frequency of Social Gatherings with Friends and Family:    Attends Religious Services:    Active Member of Clubs or Organizations:    Attends Music therapist:    Marital Status:   Intimate Partner Violence:    Fear of Current or Ex-Partner:    Emotionally Abused:    Physically Abused:    Sexually Abused:      ROS- All systems are reviewed and negative except as per the HPI above.  Physical Exam: Vitals:   11/07/19 1519  BP: 100/70  Pulse: (!) 101  Weight: 66.2 kg  Height: 5\' 3"  (1.6 m)    GEN- The patient is well appearing elderly female, alert today.   Head- normocephalic, atraumatic Eyes-  Sclera clear, conjunctiva pink Ears-  hearing intact Oropharynx- clear Neck- supple  Lungs- Clear to ausculation bilaterally, normal work of breathing Heart- irregular rate and rhythm, no murmurs, rubs or gallops  GI- soft, NT, ND, + BS Extremities- no clubbing, cyanosis, or edema MS- no significant deformity or atrophy Skin- no rash or lesion Psych- euthymic mood, full affect Neuro- strength and sensation are intact  Wt Readings from Last 3 Encounters:  11/07/19 66.2 kg  11/06/19 66.2 kg  08/21/15 66.3 kg    EKG today demonstrates afib HR 101, LBBB, QRS 142, QTc 428  Echo 01/06/16 demonstrated  - Procedure narrative:  A-fib with short pauses was noted.  - Left ventricle: The cavity size was normal. Wall thickness was  increased in a pattern of moderate LVH. Systolic function was  mildly reduced. The estimated ejection fraction was in the range  of 45% to 50%. Inferoseptal hypokinesis. The study is not  technically sufficient to allow evaluation of LV diastolic  function. The E/e&' ratio is >15, suggesting elevated LV filling  pressure.  - Aortic valve: Trileaflet. Sclerosis without stenosis. There was  moderate regurgitation.  - Mitral valve: Sclerotic leaflets. Trivial regurgitation.  - Left atrium: Moderately dilated.  - Atrial septum: Aneurysmal IAS - cannot exclude small PFO.  - Inferior vena cava: The vessel was normal in size. The  respirophasic diameter changes were in the normal range (= 50%),  consistent with normal central venous pressure.   Impressions:   - LVEF 45-50%, moderate LVH, inferoseptal hypokinesis, elevated LV  fillling pressure, aortic valve sclerosis with moderate AI,  trivial MR, moderate LAE, aneurysmal IAS - cannot exclude small  PFO, normal IVC.   Epic records are reviewed at length today  CHA2DS2-VASc Score = 4  The patient's score is based upon: CHF History: 1 HTN History: 0 Age : 2 Diabetes History: 0 Stroke History: 0 Vascular Disease History:  0 Gender: 1      ASSESSMENT AND PLAN: 1. Persistent Atrial Fibrillation (ICD10:  I48.19) The patient's CHA2DS2-VASc score is 4, indicating a 4.8% annual risk of stroke.   General education about afib provided and questions answered. Agree with continuing Eliquis 5 mg BID for stroke prevention. We had a long discussion with the patient and family about rate vs rhythm control. Patient is clear that she does not want to come to the hospital. Will pursue a conservative rate control strategy at this time. Goal for heart rate <110 bpm at rest. Titrate rate control cautiously given borderline BP. Diltiazem 120 mg daily just started yesterday. Could consider increase PM dose of Coreg to 12.5 mg if needed.   2. Secondary Hypercoagulable State (ICD10:  D68.69) The patient is at significant risk for stroke/thromboembolism based upon her CHA2DS2-VASc Score of 4.  Continue Apixaban (Eliquis).   3. Chronic systolic CHF No signs or symptoms of fluid overload today.   Follow up with PCP. Patient is clear that she would rather not return to the AF clinic at this time.    [facility] [address] [place], [place] [address] [phone] 11/07/2019 4:28 PM

## 2019-11-08 ENCOUNTER — Non-Acute Institutional Stay (SKILLED_NURSING_FACILITY): Payer: Medicare Other | Admitting: Internal Medicine

## 2019-11-08 ENCOUNTER — Encounter: Payer: Self-pay | Admitting: Internal Medicine

## 2019-11-08 DIAGNOSIS — I5022 Chronic systolic (congestive) heart failure: Secondary | ICD-10-CM

## 2019-11-08 DIAGNOSIS — I10 Essential (primary) hypertension: Secondary | ICD-10-CM | POA: Diagnosis not present

## 2019-11-08 DIAGNOSIS — I480 Paroxysmal atrial fibrillation: Secondary | ICD-10-CM | POA: Diagnosis not present

## 2019-11-08 DIAGNOSIS — F0391 Unspecified dementia with behavioral disturbance: Secondary | ICD-10-CM

## 2019-11-08 DIAGNOSIS — E871 Hypo-osmolality and hyponatremia: Secondary | ICD-10-CM

## 2019-11-08 NOTE — Progress Notes (Signed)
Provider:  Veleta Miners MD Location:   [facility] Room Number: 3 Place of Service:  SNF ([id number])  PCP: Crist Infante, MD Patient Care Team: Crist Infante, MD as PCP - General (Internal Medicine)  Extended Emergency Contact Information Primary Emergency Contact: Jungwirth,Robert Address: [place] [address] [address] Phone: [phone] Mobile Phone: [phone] Relation: Spouse Secondary Emergency Contact: Alan Ripper Address: [address]          [place], [place] [address] Home Phone: [phone] Mobile Phone: [phone] Relation: Daughter  Code Status: Full code Goals of Care: Advanced Directive information Advanced Directives 11/08/2019  Does Patient Have a Medical Advance Directive? Yes  Type of Advance Directive Living will  Does patient want to make changes to medical advance directive? No - Patient declined  Copy of [place] in Chart? -  Would patient like information on creating a medical advance directive? -      Chief Complaint  Patient presents with  . Readmit To SNF    Admission    HPI: Patient is a 86 y.o. female seen today for admission to SNF for Long term Care  Patient has a history of dementia ,Paroxysmal atrial fibrillation.Also h/o Systolic CHF  She was unable to give me any history but her daughter was in the room who assisted. According to the daughter patient has had dementia for many years.  She has not seen a neurologist.  It does not look like patient has never been on Aricept//Namenda. Recently for past few months patient has been having behavior issues including agitation and interfering in her husband's care.  Her husband is cognitively intact.  Physically has caregivers who help him.  He was unable to take care of her and the family decided to transfer her to long-term care.  She was recently also started on Depakote and Zyprexa to help with her agitation  and aggression. She also has h/o PAF.  Daughter is not sure if she was taking her medicines.  But at this time they do not want to follow-up with cardiology or any outside physician.  Supposed to be on Eliquis Coreg and Cardizem which was added Also has h/o Systolic CHF Was on Lasix but not taking it recently  Patient did not have any acute complains Denied any SOB or chest Pain  Past Medical History:  Diagnosis Date  . Abdominal pain, lower 06/20/2015  . Atrial fibrillation ([place])   . CAD (coronary artery disease) of artery bypass graft 06/25/2015  . Chronic anticoagulation   . Chronic cough 11/03/2011  . Debility 06/25/2015  . Dyspnea 06/25/2015  . Edema of both legs 06/25/2015   07/07/15 BNP 363.7   . Hemorrhoids   . High cholesterol   . Hypokalemia 07/01/2015   07/07/15 K 4.8   . Hyponatremia 06/25/2015   07/07/15 Na 134   . IBS (irritable bowel syndrome) 06/25/2015  . LBBB (left bundle branch block)   . Nonsustained ventricular tachycardia ([place]) 06/25/2015  . Pneumonia 06/25/2015  . Systolic CHF ([place]) 10/21/2977   07/07/15 BNP 363.7   . Systolic heart failure ([place])   . Weakness generalized 06/25/2015   Past Surgical History:  Procedure Laterality Date  . cataracts    . TONSILLECTOMY AND ADENOIDECTOMY      reports that she quit smoking about 43 years ago. Her smoking use included cigarettes. She has a 25.00 pack-year smoking history. She has  never used smokeless tobacco. She reports previous alcohol use. She reports that she does not use drugs. Social History   Socioeconomic History  . Marital status: Married    Spouse name: Not on file  . Number of children: 2  . Years of education: Not on file  . Highest education level: Not on file  Occupational History  . Not on file  Tobacco Use  . Smoking status: Former Smoker    Packs/day: 1.00    Years: 25.00    Pack years: 25.00    Types: Cigarettes    Quit date: 10/30/1976    Years since quitting: 43.0  . Smokeless tobacco: Never Used    Substance and Sexual Activity  . Alcohol use: Not Currently    Comment: wine  . Drug use: No  . Sexual activity: Not on file  Other Topics Concern  . Not on file  Social History Narrative  . Not on file   Social Determinants of Health   Financial Resource Strain:   . Difficulty of Paying Living Expenses:   Food Insecurity:   . Worried About Charity fundraiser in the Last Year:   . Arboriculturist in the Last Year:   Transportation Needs:   . Film/video editor (Medical):   Marland Kitchen Lack of Transportation (Non-Medical):   Physical Activity:   . Days of Exercise per Week:   . Minutes of Exercise per Session:   Stress:   . Feeling of Stress :   Social Connections:   . Frequency of Communication with Friends and Family:   . Frequency of Social Gatherings with Friends and Family:   . Attends Religious Services:   . Active Member of Clubs or Organizations:   . Attends Archivist Meetings:   Marland Kitchen Marital Status:   Intimate Partner Violence:   . Fear of Current or Ex-Partner:   . Emotionally Abused:   Marland Kitchen Physically Abused:   . Sexually Abused:     Functional Status Survey:    Family History  Problem Relation Age of Onset  . Heart attack Father     Health Maintenance  Topic Date Due  . COVID-19 Vaccine (1) Never done  . TETANUS/TDAP  Never done  . DEXA SCAN  Never done  . PNA vac Low Risk Adult (1 of 2 - PCV13) Never done  . INFLUENZA VACCINE  12/01/2019    Allergies  Allergen Reactions  . Aspirin     Makes her feel unpleasant  . Lidocaine Other (See Comments)    Reaction unknown  . Mevacor [Lovastatin] Other (See Comments)    Reaction unknown  . Penicillins Other (See Comments)    "Patient unable to remember reaction"  . Sulfa Antibiotics     " Patient unable to remember reaction"    Allergies as of 11/08/2019      Reactions   Aspirin    Makes her feel unpleasant   Lidocaine Other (See Comments)   Reaction unknown   Mevacor [lovastatin] Other  (See Comments)   Reaction unknown   Penicillins Other (See Comments)   "Patient unable to remember reaction"   Sulfa Antibiotics    " Patient unable to remember reaction"      Medication List       Accurate as of November 08, 2019  2:27 PM. If you have any questions, ask your nurse or doctor.        carvedilol 6.25 MG tablet Commonly known as: COREG Take  6.25 mg by mouth 2 (two) times daily with a meal.   CENTRUM SILVER PO Take 1 tablet by mouth daily.   diltiazem 120 MG 24 hr capsule Commonly known as: CARDIZEM CD Take 120 mg by mouth daily.   divalproex 125 MG capsule Commonly known as: DEPAKOTE SPRINKLE Take 125 mg by mouth 2 (two) times daily.   Eliquis 5 MG Tabs tablet Generic drug: apixaban Take 5 mg by mouth 2 (two) times daily.   OLANZapine 5 MG tablet Commonly known as: ZYPREXA Take 5 mg by mouth at bedtime.   rosuvastatin 5 MG tablet Commonly known as: CRESTOR Take 5 mg by mouth daily.   VITAMIN D3 PO Take 1 tablet by mouth daily. Vitamin D3 200IU oral tablet       Review of Systems  Unable to perform ROS: Dementia    Say no for everything  Vitals:   11/08/19 1424  BP: 100/70  Pulse: (!) 101  Weight: 145 lb (65.8 kg)  Height: 5\' 3"  (1.6 m)   Body mass index is 25.69 kg/m. Physical Exam Vitals reviewed.  Constitutional:      Appearance: Normal appearance.  HENT:     Head: Normocephalic.     Nose: Nose normal.     Mouth/Throat:     Mouth: Mucous membranes are moist.     Pharynx: Oropharynx is clear.  Eyes:     Pupils: Pupils are equal, round, and reactive to light.  Cardiovascular:     Rate and Rhythm: Normal rate. Rhythm irregular.     Pulses: Normal pulses.     Heart sounds: Normal heart sounds.  Pulmonary:     Effort: Pulmonary effort is normal. No respiratory distress.     Breath sounds: Normal breath sounds. No wheezing.  Abdominal:     General: Abdomen is flat. Bowel sounds are normal.     Palpations: Abdomen is soft.    Musculoskeletal:        General: No swelling.     Cervical back: Neck supple.  Skin:    General: Skin is warm.  Neurological:     General: No focal deficit present.     Mental Status: She is alert.  Psychiatric:        Mood and Affect: Mood normal.     Labs reviewed: Basic Metabolic Panel: Recent Labs    11/02/19 0930 11/06/19 0000  NA 133* 129*  K 4.9 4.8  CL 99 99  CO2 22 22  GLUCOSE 134*  --   BUN 35* 35*  CREATININE 0.92 0.8  CALCIUM 8.8* 8.9   Liver Function Tests: Recent Labs    11/06/19 0000  AST 84*  ALT 176*  ALKPHOS 92  ALBUMIN 3.2*   No results for input(s): LIPASE, AMYLASE in the last 8760 hours. No results for input(s): AMMONIA in the last 8760 hours. CBC: Recent Labs    11/02/19 0930 11/06/19 0000  WBC 9.8 8.8  NEUTROABS 7.6 6,908  HGB 15.7* 46.9*  HCT 47.0* 47*  MCV 97.1  --   PLT 190 180   Cardiac Enzymes: No results for input(s): CKTOTAL, CKMB, CKMBINDEX, TROPONINI in the last 8760 hours. BNP: Invalid input(s): POCBNP No results found for: HGBA1C Lab Results  Component Value Date   TSH 4.64 11/06/2019   No results found for: VITAMINB12 No results found for: FOLATE No results found for: IRON, TIBC, FERRITIN  Imaging and Procedures obtained prior to SNF admission: No results found.  Assessment/Plan Paroxysmal atrial fibrillation ([place])  Rate controlled on Coreg and Cardizem Tolerating Eliquis  Chronic systolic congestive heart failure (HCC) Not on Lasix anymore Will continue to follow Volume status Hyponatremia Has h/o Repeat BMP in 2 weeks Dementia with behavioral disturbance,  ([place]) Was recently started on Depakote and Zyprexa Will continue on both for now Taper Zyprexa soon once she gets adjusted Repeat Hepatic panel in 6 weeks Hyperlipidemia LDL high but not sure was taking her meds Continue Crestor Repeat Lipid in few weeks   Family/ staff Communication: Her daughter  Labs/tests ordered: Repeat BMP for  Hyponatremia Repeat Hepatic panel and CBC in 6 weeks for Depakote Follow up  Total time spent in this patient care encounter was  45_  minutes; greater than 50% of the visit spent counseling patient and staff, reviewing records , Labs and coordinating care for problems addressed at this encounter.

## 2019-11-09 LAB — BASIC METABOLIC PANEL
BUN: 25 — AB (ref 4–21)
CO2: 26 — AB (ref 13–22)
Chloride: 101 (ref 99–108)
Creatinine: 0.7 (ref 0.5–1.1)
Glucose: 97
Potassium: 4.8 (ref 3.4–5.3)
Sodium: 134 — AB (ref 137–147)

## 2019-11-09 LAB — COMPREHENSIVE METABOLIC PANEL
Albumin: 3.2 — AB (ref 3.5–5.0)
Calcium: 8.8 (ref 8.7–10.7)
Globulin: 1.6

## 2019-11-09 LAB — HEPATIC FUNCTION PANEL
ALT: 110 — AB (ref 7–35)
AST: 41 — AB (ref 13–35)
Alkaline Phosphatase: 77 (ref 25–125)
Bilirubin, Total: 0.7

## 2019-11-12 DIAGNOSIS — M5186 Other intervertebral disc disorders, lumbar region: Secondary | ICD-10-CM | POA: Diagnosis not present

## 2019-11-12 DIAGNOSIS — I472 Ventricular tachycardia: Secondary | ICD-10-CM | POA: Diagnosis not present

## 2019-11-12 DIAGNOSIS — I480 Paroxysmal atrial fibrillation: Secondary | ICD-10-CM | POA: Diagnosis not present

## 2019-11-12 DIAGNOSIS — K588 Other irritable bowel syndrome: Secondary | ICD-10-CM | POA: Diagnosis not present

## 2019-11-12 DIAGNOSIS — R0600 Dyspnea, unspecified: Secondary | ICD-10-CM | POA: Diagnosis not present

## 2019-11-12 DIAGNOSIS — M6281 Muscle weakness (generalized): Secondary | ICD-10-CM | POA: Diagnosis not present

## 2019-11-12 DIAGNOSIS — D6869 Other thrombophilia: Secondary | ICD-10-CM | POA: Diagnosis not present

## 2019-11-12 DIAGNOSIS — I5022 Chronic systolic (congestive) heart failure: Secondary | ICD-10-CM | POA: Diagnosis not present

## 2019-11-12 DIAGNOSIS — I4819 Other persistent atrial fibrillation: Secondary | ICD-10-CM | POA: Diagnosis not present

## 2019-11-12 DIAGNOSIS — E785 Hyperlipidemia, unspecified: Secondary | ICD-10-CM | POA: Diagnosis not present

## 2019-11-12 DIAGNOSIS — R29898 Other symptoms and signs involving the musculoskeletal system: Secondary | ICD-10-CM | POA: Diagnosis not present

## 2019-11-12 DIAGNOSIS — R600 Localized edema: Secondary | ICD-10-CM | POA: Diagnosis not present

## 2019-11-12 DIAGNOSIS — K649 Unspecified hemorrhoids: Secondary | ICD-10-CM | POA: Diagnosis not present

## 2019-11-12 DIAGNOSIS — R2681 Unsteadiness on feet: Secondary | ICD-10-CM | POA: Diagnosis not present

## 2019-11-12 DIAGNOSIS — R41841 Cognitive communication deficit: Secondary | ICD-10-CM | POA: Diagnosis not present

## 2019-11-12 DIAGNOSIS — N390 Urinary tract infection, site not specified: Secondary | ICD-10-CM | POA: Diagnosis not present

## 2019-11-12 DIAGNOSIS — K5190 Ulcerative colitis, unspecified, without complications: Secondary | ICD-10-CM | POA: Diagnosis not present

## 2019-11-12 DIAGNOSIS — F0391 Unspecified dementia with behavioral disturbance: Secondary | ICD-10-CM | POA: Diagnosis not present

## 2019-11-12 DIAGNOSIS — H269 Unspecified cataract: Secondary | ICD-10-CM | POA: Diagnosis not present

## 2019-11-12 DIAGNOSIS — M545 Low back pain: Secondary | ICD-10-CM | POA: Diagnosis not present

## 2019-11-12 DIAGNOSIS — I2510 Atherosclerotic heart disease of native coronary artery without angina pectoris: Secondary | ICD-10-CM | POA: Diagnosis not present

## 2019-11-12 DIAGNOSIS — Z7389 Other problems related to life management difficulty: Secondary | ICD-10-CM | POA: Diagnosis not present

## 2019-11-14 DIAGNOSIS — R2681 Unsteadiness on feet: Secondary | ICD-10-CM | POA: Diagnosis not present

## 2019-11-14 DIAGNOSIS — R29898 Other symptoms and signs involving the musculoskeletal system: Secondary | ICD-10-CM | POA: Diagnosis not present

## 2019-11-14 DIAGNOSIS — I5022 Chronic systolic (congestive) heart failure: Secondary | ICD-10-CM | POA: Diagnosis not present

## 2019-11-14 DIAGNOSIS — R41841 Cognitive communication deficit: Secondary | ICD-10-CM | POA: Diagnosis not present

## 2019-11-14 DIAGNOSIS — M6281 Muscle weakness (generalized): Secondary | ICD-10-CM | POA: Diagnosis not present

## 2019-11-14 DIAGNOSIS — Z7389 Other problems related to life management difficulty: Secondary | ICD-10-CM | POA: Diagnosis not present

## 2019-11-15 ENCOUNTER — Non-Acute Institutional Stay (SKILLED_NURSING_FACILITY): Payer: Medicare Other | Admitting: Internal Medicine

## 2019-11-15 ENCOUNTER — Encounter: Payer: Self-pay | Admitting: Internal Medicine

## 2019-11-15 DIAGNOSIS — I480 Paroxysmal atrial fibrillation: Secondary | ICD-10-CM | POA: Diagnosis not present

## 2019-11-15 DIAGNOSIS — Z7389 Other problems related to life management difficulty: Secondary | ICD-10-CM | POA: Diagnosis not present

## 2019-11-15 DIAGNOSIS — F0391 Unspecified dementia with behavioral disturbance: Secondary | ICD-10-CM

## 2019-11-15 DIAGNOSIS — K625 Hemorrhage of anus and rectum: Secondary | ICD-10-CM

## 2019-11-15 DIAGNOSIS — M6281 Muscle weakness (generalized): Secondary | ICD-10-CM | POA: Diagnosis not present

## 2019-11-15 DIAGNOSIS — R2681 Unsteadiness on feet: Secondary | ICD-10-CM | POA: Diagnosis not present

## 2019-11-15 DIAGNOSIS — I5022 Chronic systolic (congestive) heart failure: Secondary | ICD-10-CM | POA: Diagnosis not present

## 2019-11-15 DIAGNOSIS — R41841 Cognitive communication deficit: Secondary | ICD-10-CM | POA: Diagnosis not present

## 2019-11-15 DIAGNOSIS — R29898 Other symptoms and signs involving the musculoskeletal system: Secondary | ICD-10-CM | POA: Diagnosis not present

## 2019-11-15 NOTE — Progress Notes (Signed)
Location:   [facility] Room Number: 3 Place of Service:  SNF [phone]) Provider:  Veleta Miners MD   Virgie Dad, MD  Patient Care Team: Virgie Dad, MD as PCP - General (Internal Medicine)  Extended Emergency Contact Information Primary Emergency Contact: Granville,Robert Address: [place] [address] [address] of [place] Phone: [phone] Mobile Phone: [phone] Relation: Spouse Secondary Emergency Contact: Alan Ripper Address: [address]          [place], [place] [address] Home Phone: [phone] Mobile Phone: [phone] Relation: Daughter  Code Status:  Full Code Goals of care: Advanced Directive information Advanced Directives 11/08/2019  Does Patient Have a Medical Advance Directive? Yes  Type of Advance Directive Living will  Does patient want to make changes to medical advance directive? No - Patient declined  Copy of [place] in Chart? -  Would patient like information on creating a medical advance directive? -     Chief Complaint  Patient presents with  . Acute Visit    rectal bleed    HPI:  Pt is a 86 y.o. female seen today for an acute visit for Rectal Bleed   Patient has a history of dementia ,Paroxysmal atrial fibrillation.Also h/o Systolic CHF  Patient is a recent admit to SNF. She has a history of dementia. Her husband was unable to take care of her due to her agitation and aggression. She was started on Depakote and Zyprexa by her PCP. Patient was noticed to have rectal bleed. Per nurses they saw bright red blood in her diaper. Patient denies any pain. No constipation. She said that she does have history of hemorrhoids and has had surgery before  Past Medical History:  Diagnosis Date  . Abdominal pain, lower 06/20/2015  . Atrial fibrillation ([place])   . CAD (coronary artery disease) of artery bypass graft 06/25/2015  . Chronic anticoagulation   . Chronic cough  11/03/2011  . Debility 06/25/2015  . Dyspnea 06/25/2015  . Edema of both legs 06/25/2015   07/07/15 BNP 363.7   . Hemorrhoids   . High cholesterol   . Hypokalemia 07/01/2015   07/07/15 K 4.8   . Hyponatremia 06/25/2015   07/07/15 Na 134   . IBS (irritable bowel syndrome) 06/25/2015  . LBBB (left bundle branch block)   . Nonsustained ventricular tachycardia ([place]) 06/25/2015  . Pneumonia 06/25/2015  . Systolic CHF ([place]) 0/12/2328   07/07/15 BNP 363.7   . Systolic heart failure ([place])   . Weakness generalized 06/25/2015   Past Surgical History:  Procedure Laterality Date  . cataracts    . TONSILLECTOMY AND ADENOIDECTOMY      Allergies  Allergen Reactions  . Aspirin     Makes her feel unpleasant  . Lidocaine Other (See Comments)    Reaction unknown  . Mevacor [Lovastatin] Other (See Comments)    Reaction unknown  . Penicillins Other (See Comments)    "Patient unable to remember reaction"  . Sulfa Antibiotics     " Patient unable to remember reaction"    Allergies as of 11/15/2019      Reactions   Aspirin    Makes her feel unpleasant   Lidocaine Other (See Comments)   Reaction unknown   Mevacor [lovastatin] Other (See Comments)   Reaction unknown   Penicillins Other (See Comments)   "Patient unable to remember reaction"   Sulfa Antibiotics    "  Patient unable to remember reaction"      Medication List       Accurate as of November 15, 2019  2:58 PM. If you have any questions, ask your nurse or doctor.        carvedilol 6.25 MG tablet Commonly known as: COREG Take 6.25 mg by mouth 2 (two) times daily with a meal.   CENTRUM SILVER PO Take 1 tablet by mouth daily.   diltiazem 120 MG 24 hr capsule Commonly known as: CARDIZEM CD Take 120 mg by mouth daily.   divalproex 125 MG capsule Commonly known as: DEPAKOTE SPRINKLE Take 125 mg by mouth 2 (two) times daily.   Eliquis 5 MG Tabs tablet Generic drug: apixaban Take 5 mg by mouth 2 (two) times daily.   OLANZapine 5 MG  tablet Commonly known as: ZYPREXA Take 5 mg by mouth at bedtime.   rosuvastatin 5 MG tablet Commonly known as: CRESTOR Take 5 mg by mouth daily.   Tubersol 5 UNIT/0.1ML injection Generic drug: tuberculin Inject into the skin once.   VITAMIN D3 PO Take 1 tablet by mouth daily. Vitamin D3 200IU oral tablet       Review of Systems  Constitutional: Positive for activity change and appetite change.  HENT: Negative.   Respiratory: Negative.   Cardiovascular: Positive for leg swelling.  Gastrointestinal: Negative for constipation.  Genitourinary: Negative.   Musculoskeletal: Negative.   Skin: Negative.   Neurological: Negative.   Psychiatric/Behavioral: Positive for behavioral problems and confusion.    Immunization History  Administered Date(s) Administered  . Influenza Whole 01/01/2011   Pertinent  Health Maintenance Due  Topic Date Due  . DEXA SCAN  Never done  . PNA vac Low Risk Adult (1 of 2 - PCV13) Never done  . INFLUENZA VACCINE  12/01/2019   No flowsheet data found. Functional Status Survey:    Vitals:   11/15/19 1455  BP: 122/70  Pulse: 92  Resp: (!) 24  Temp: 99.1 F (37.3 C)  SpO2: 95%  Weight: 150 lb 1.6 oz (68.1 kg)  Height: 5\' 3"  (1.6 m)   Body mass index is 26.59 kg/m. Physical Exam Vitals reviewed.  Constitutional:      Appearance: Normal appearance.  HENT:     Head: Normocephalic.     Nose: Nose normal.     Mouth/Throat:     Mouth: Mucous membranes are moist.     Pharynx: Oropharynx is clear.  Eyes:     Pupils: Pupils are equal, round, and reactive to light.  Cardiovascular:     Rate and Rhythm: Normal rate and regular rhythm.  Pulmonary:     Effort: Pulmonary effort is normal.     Breath sounds: Normal breath sounds.  Abdominal:     General: Abdomen is flat. Bowel sounds are normal.     Palpations: Abdomen is soft.     Comments: On rectal exam patient had inflamed external hemorrhoids. Her stools were occult negative    Musculoskeletal:        General: Swelling present.     Cervical back: Neck supple.  Skin:    General: Skin is warm.  Neurological:     General: No focal deficit present.     Mental Status: She is alert.  Psychiatric:        Mood and Affect: Mood normal.     Labs reviewed: Recent Labs    11/02/19 0930 11/06/19 0000 11/09/19 0000  NA 133* 129* 134*  K 4.9 4.8 4.8  CL 99 99 101  CO2 22 22 26*  GLUCOSE 134*  --   --   BUN 35* 35* 25*  CREATININE 0.92 0.8 0.7  CALCIUM 8.8* 8.9 8.8   Recent Labs    11/06/19 0000 11/09/19 0000  AST 84* 41*  ALT 176* 110*  ALKPHOS 92 77  ALBUMIN 3.2* 3.2*   Recent Labs    11/02/19 0930 11/06/19 0000  WBC 9.8 8.8  NEUTROABS 7.6 6,908  HGB 15.7* 46.9*  HCT 47.0* 47*  MCV 97.1  --   PLT 190 180   Lab Results  Component Value Date   TSH 4.64 11/06/2019   No results found for: HGBA1C Lab Results  Component Value Date   CHOL 157 11/06/2019   HDL 20 (A) 11/06/2019   LDLCALC 116 11/06/2019   TRIG 107 11/06/2019    Significant Diagnostic Results in last 30 days:  No results found.  Assessment/Plan  BRBPR (bright red blood per rectum) Occult negative Does have external hemorrhoids We'll start her on Anusol twice daily Repeat CBC in 1 week  Paroxysmal atrial fibrillation (HCC) For right now we'll continue the Eliquis Cardizem and Coreg Dementia with behavioral disturbance,  Her behavior is better here. Will reduce her Zyprexa to 2.5  Mg Continue Depakote right now Her hepatic function test were elevated but now coming down Will Repeat them in 1 week If it still elevated will need Depakote stopped  Hyperlipidemia LDL high but not sure was taking her meds Continue Crestor Repeat Lipid in few weeks  Chronic systolic congestive heart failure ([place]) Not on Lasix anymore Will continue to follow Volume status Hyponatremia Repeat Sodium is better Family/ staff Communication:   Labs/tests ordered:  Hepatic Panel and  CBC in 1 week

## 2019-11-19 DIAGNOSIS — I5022 Chronic systolic (congestive) heart failure: Secondary | ICD-10-CM | POA: Diagnosis not present

## 2019-11-19 DIAGNOSIS — R2681 Unsteadiness on feet: Secondary | ICD-10-CM | POA: Diagnosis not present

## 2019-11-19 DIAGNOSIS — M6281 Muscle weakness (generalized): Secondary | ICD-10-CM | POA: Diagnosis not present

## 2019-11-19 DIAGNOSIS — Z7389 Other problems related to life management difficulty: Secondary | ICD-10-CM | POA: Diagnosis not present

## 2019-11-19 DIAGNOSIS — R29898 Other symptoms and signs involving the musculoskeletal system: Secondary | ICD-10-CM | POA: Diagnosis not present

## 2019-11-19 DIAGNOSIS — R41841 Cognitive communication deficit: Secondary | ICD-10-CM | POA: Diagnosis not present

## 2019-11-20 DIAGNOSIS — R2681 Unsteadiness on feet: Secondary | ICD-10-CM | POA: Diagnosis not present

## 2019-11-20 DIAGNOSIS — Z7389 Other problems related to life management difficulty: Secondary | ICD-10-CM | POA: Diagnosis not present

## 2019-11-20 DIAGNOSIS — I5022 Chronic systolic (congestive) heart failure: Secondary | ICD-10-CM | POA: Diagnosis not present

## 2019-11-20 DIAGNOSIS — R41841 Cognitive communication deficit: Secondary | ICD-10-CM | POA: Diagnosis not present

## 2019-11-20 DIAGNOSIS — R29898 Other symptoms and signs involving the musculoskeletal system: Secondary | ICD-10-CM | POA: Diagnosis not present

## 2019-11-20 DIAGNOSIS — M6281 Muscle weakness (generalized): Secondary | ICD-10-CM | POA: Diagnosis not present

## 2019-11-21 DIAGNOSIS — I10 Essential (primary) hypertension: Secondary | ICD-10-CM | POA: Diagnosis not present

## 2019-11-21 LAB — HEPATIC FUNCTION PANEL
ALT: 35 (ref 7–35)
AST: 21 (ref 13–35)
Alkaline Phosphatase: 58 (ref 25–125)
Bilirubin, Total: 1

## 2019-11-21 LAB — COMPREHENSIVE METABOLIC PANEL
Albumin: 3.4 — AB (ref 3.5–5.0)
Globulin: 2.1

## 2019-11-21 LAB — CBC AND DIFFERENTIAL
HCT: 49 — AB (ref 36–46)
Hemoglobin: 16.6 — AB (ref 12.0–16.0)
Neutrophils Absolute: 8226
WBC: 10.4

## 2019-11-21 LAB — CBC: RBC: 5.16 — AB (ref 3.87–5.11)

## 2019-11-22 DIAGNOSIS — R41841 Cognitive communication deficit: Secondary | ICD-10-CM | POA: Diagnosis not present

## 2019-11-22 DIAGNOSIS — Z7389 Other problems related to life management difficulty: Secondary | ICD-10-CM | POA: Diagnosis not present

## 2019-11-22 DIAGNOSIS — R2681 Unsteadiness on feet: Secondary | ICD-10-CM | POA: Diagnosis not present

## 2019-11-22 DIAGNOSIS — I5022 Chronic systolic (congestive) heart failure: Secondary | ICD-10-CM | POA: Diagnosis not present

## 2019-11-22 DIAGNOSIS — R29898 Other symptoms and signs involving the musculoskeletal system: Secondary | ICD-10-CM | POA: Diagnosis not present

## 2019-11-22 DIAGNOSIS — M6281 Muscle weakness (generalized): Secondary | ICD-10-CM | POA: Diagnosis not present

## 2019-11-25 ENCOUNTER — Encounter: Payer: Self-pay | Admitting: Nurse Practitioner

## 2019-11-25 ENCOUNTER — Non-Acute Institutional Stay (SKILLED_NURSING_FACILITY): Payer: Medicare Other | Admitting: Nurse Practitioner

## 2019-11-25 DIAGNOSIS — R0602 Shortness of breath: Secondary | ICD-10-CM | POA: Diagnosis not present

## 2019-11-25 DIAGNOSIS — E871 Hypo-osmolality and hyponatremia: Secondary | ICD-10-CM | POA: Diagnosis not present

## 2019-11-25 DIAGNOSIS — R600 Localized edema: Secondary | ICD-10-CM

## 2019-11-25 DIAGNOSIS — R748 Abnormal levels of other serum enzymes: Secondary | ICD-10-CM

## 2019-11-25 DIAGNOSIS — K644 Residual hemorrhoidal skin tags: Secondary | ICD-10-CM | POA: Diagnosis not present

## 2019-11-25 DIAGNOSIS — I480 Paroxysmal atrial fibrillation: Secondary | ICD-10-CM | POA: Diagnosis not present

## 2019-11-25 DIAGNOSIS — R05 Cough: Secondary | ICD-10-CM

## 2019-11-25 DIAGNOSIS — R053 Chronic cough: Secondary | ICD-10-CM

## 2019-11-25 DIAGNOSIS — R29898 Other symptoms and signs involving the musculoskeletal system: Secondary | ICD-10-CM | POA: Diagnosis not present

## 2019-11-25 DIAGNOSIS — R4189 Other symptoms and signs involving cognitive functions and awareness: Secondary | ICD-10-CM

## 2019-11-25 DIAGNOSIS — I5022 Chronic systolic (congestive) heart failure: Secondary | ICD-10-CM | POA: Diagnosis not present

## 2019-11-25 DIAGNOSIS — R5383 Other fatigue: Secondary | ICD-10-CM | POA: Diagnosis not present

## 2019-11-25 DIAGNOSIS — R41841 Cognitive communication deficit: Secondary | ICD-10-CM | POA: Diagnosis not present

## 2019-11-25 DIAGNOSIS — N390 Urinary tract infection, site not specified: Secondary | ICD-10-CM | POA: Diagnosis not present

## 2019-11-25 DIAGNOSIS — Z7389 Other problems related to life management difficulty: Secondary | ICD-10-CM | POA: Diagnosis not present

## 2019-11-25 DIAGNOSIS — I10 Essential (primary) hypertension: Secondary | ICD-10-CM | POA: Diagnosis not present

## 2019-11-25 DIAGNOSIS — R2681 Unsteadiness on feet: Secondary | ICD-10-CM | POA: Diagnosis not present

## 2019-11-25 DIAGNOSIS — M6281 Muscle weakness (generalized): Secondary | ICD-10-CM | POA: Diagnosis not present

## 2019-11-25 LAB — COMPREHENSIVE METABOLIC PANEL
Albumin: 5.2 — AB (ref 3.5–5.0)
Calcium: 8.8 (ref 8.7–10.7)
GFR calc Af Amer: 58
GFR calc non Af Amer: 50
Globulin: 3.3

## 2019-11-25 LAB — BASIC METABOLIC PANEL
BUN: 60 — AB (ref 4–21)
CO2: 25 — AB (ref 13–22)
Chloride: 90 — AB (ref 99–108)
Creatinine: 1 (ref 0.5–1.1)
Glucose: 119
Potassium: 6.1 — AB (ref 3.4–5.3)
Sodium: 123 — AB (ref 137–147)

## 2019-11-25 LAB — CBC AND DIFFERENTIAL
HCT: 53 — AB (ref 36–46)
Hemoglobin: 17.6 — AB (ref 12.0–16.0)
Neutrophils Absolute: 10939
Platelets: 174 (ref 150–399)
WBC: 13.1

## 2019-11-25 LAB — HEPATIC FUNCTION PANEL
ALT: 34 (ref 7–35)
AST: 21 (ref 13–35)
Alkaline Phosphatase: 64 (ref 25–125)
Bilirubin, Total: 1.5

## 2019-11-25 LAB — CBC: RBC: 5.59 — AB (ref 3.87–5.11)

## 2019-11-25 NOTE — Assessment & Plan Note (Signed)
Na 134 last checked.

## 2019-11-25 NOTE — Assessment & Plan Note (Signed)
Had BRBPR, negative FOBT, uses Anusol bid, no constipation presently.

## 2019-11-25 NOTE — Assessment & Plan Note (Signed)
Not apparent.  

## 2019-11-25 NOTE — Progress Notes (Signed)
Location:   SNF [facility] Room Number: 3 Place of Service:  SNF (31) Provider: [facility] Janes Colegrove NP  Virgie Dad, MD  Patient Care Team: Virgie Dad, MD as PCP - General (Internal Medicine)  Extended Emergency Contact Information Primary Emergency Contact: Koehne,Robert Address: [place] [address] [address] of [place] Phone: [phone] Mobile Phone: [phone] Relation: Spouse Secondary Emergency Contact: Alan Ripper Address: [address]          [place], [place] [address] Home Phone: [phone] Mobile Phone: [phone] Relation: Daughter  Code Status: DNR Goals of care: Advanced Directive information Advanced Directives 11/25/2019  Does Patient Have a Medical Advance Directive? Yes  Type of Advance Directive Living will  Does patient want to make changes to medical advance directive? No - Patient declined  Copy of [place] in Chart? -  Would patient like information on creating a medical advance directive? -     Chief Complaint  Patient presents with  . Acute Visit    Lethargy    HPI:  Pt is a 86 y.o. female seen today for an acute visit for lethargy, cough, she is afebrile, follows direction, no O2 desaturation. She denied headache, chest pain/pressure, palpitation, abd pain, dysuria, or urinary urgency.    BRBPR, negative FOBT, external hemorrhoids uses Anusol bid  Afib, takes Diltiazem, Carvedilol, Eliquis.   Dementia/mood, takes Depakote, Zyprexa was reduced to 2.29m qd 11/15/19  LFT enzymes, trended down  Hyponatremia, 134 11/09/19       Past Medical History:  Diagnosis Date  . Abdominal pain, lower 06/20/2015  . Atrial fibrillation ([place]   . CAD (coronary artery disease) of artery bypass graft 06/25/2015  . Chronic anticoagulation   . Chronic cough 11/03/2011  . Debility 06/25/2015  . Dyspnea 06/25/2015  . Edema of both legs 06/25/2015   07/07/15 BNP 363.7   . Hemorrhoids   . High  cholesterol   . Hypokalemia 07/01/2015   07/07/15 K 4.8   . Hyponatremia 06/25/2015   07/07/15 Na 134   . IBS (irritable bowel syndrome) 06/25/2015  . LBBB (left bundle branch block)   . Nonsustained ventricular tachycardia ([place] 06/25/2015  . Pneumonia 06/25/2015  . Systolic CHF ([place] 21/74/9449  07/07/15 BNP 363.7   . Systolic heart failure ([place]   . Weakness generalized 06/25/2015   Past Surgical History:  Procedure Laterality Date  . cataracts    . TONSILLECTOMY AND ADENOIDECTOMY      Allergies  Allergen Reactions  . Aspirin     Makes her feel unpleasant  . Lidocaine Other (See Comments)    Reaction unknown  . Mevacor [Lovastatin] Other (See Comments)    Reaction unknown  . Penicillins Other (See Comments)    "Patient unable to remember reaction"  . Sulfa Antibiotics     " Patient unable to remember reaction"    Allergies as of 11/25/2019      Reactions   Aspirin    Makes her feel unpleasant   Lidocaine Other (See Comments)   Reaction unknown   Mevacor [lovastatin] Other (See Comments)   Reaction unknown   Penicillins Other (See Comments)   "Patient unable to remember reaction"   Sulfa Antibiotics    " Patient unable to remember reaction"      Medication List       Accurate as of November 25, 2019 11:59 PM. If you have any questions, ask your  nurse or doctor.        Anusol-HC 2.5 % rectal cream Generic drug: hydrocortisone Apply 1 application topically 2 (two) times daily. one application, topical, Twice A Day   Anusol-HC (hydrocortisone) 2.5 % cream with perineal applicator   carvedilol 6.25 MG tablet Commonly known as: COREG Take 6.25 mg by mouth 2 (two) times daily with a meal.   CENTRUM SILVER PO Take 1 tablet by mouth daily.   diltiazem 120 MG 24 hr capsule Commonly known as: CARDIZEM CD Take 120 mg by mouth daily.   divalproex 125 MG capsule Commonly known as: DEPAKOTE SPRINKLE Take 125 mg by mouth 2 (two) times daily.   Eliquis 5 MG Tabs  tablet Generic drug: apixaban Take 5 mg by mouth 2 (two) times daily.   OLANZapine 5 MG tablet Commonly known as: ZYPREXA Take 2.5 mg by mouth at bedtime.   rosuvastatin 5 MG tablet Commonly known as: CRESTOR Take 5 mg by mouth daily.   VITAMIN D3 PO Take 1 tablet by mouth daily. Vitamin D3 200IU oral tablet       Review of Systems  Constitutional: Positive for activity change, appetite change and fatigue. Negative for fever.       Lethargic  HENT: Positive for hearing loss. Negative for congestion, trouble swallowing and voice change.   Eyes: Negative for visual disturbance.  Respiratory: Positive for cough. Negative for chest tightness and shortness of breath.   Cardiovascular: Negative for chest pain, palpitations and leg swelling.  Gastrointestinal: Negative for abdominal pain, constipation, nausea and vomiting.  Genitourinary: Negative for difficulty urinating, dysuria, frequency, hematuria and urgency.  Musculoskeletal: Positive for gait problem.  Skin: Negative for color change and pallor.  Neurological: Negative for facial asymmetry, speech difficulty, weakness, light-headedness and headaches.  Psychiatric/Behavioral: Positive for dysphoric mood. Negative for behavioral problems and sleep disturbance. The patient is not nervous/anxious.     Immunization History  Administered Date(s) Administered  . Influenza Whole 01/01/2011  . Pneumococcal-Unspecified 01/26/2010  . Unspecified SARS-COV-2 Vaccination 05/06/2019, 06/03/2019   Pertinent  Health Maintenance Due  Topic Date Due  . DEXA SCAN  Never done  . PNA vac Low Risk Adult (2 of 2 - PCV13) 01/27/2011  . INFLUENZA VACCINE  12/01/2019   No flowsheet data found. Functional Status Survey:    Vitals:   11/25/19 1141  BP: (!) 140/88  Pulse: 85  Resp: 20  Temp: 97.8 F (36.6 C)  SpO2: 92%  Weight: 155 lb 4.8 oz (70.4 kg)  Height: _0  (1.6 m)   Body mass index is 27.51 kg/m. Physical Exam Vitals and  nursing note reviewed.  Constitutional:      General: She is not in acute distress.    Appearance: She is not ill-appearing, toxic-appearing or diaphoretic.     Comments: lethargic  HENT:     Head: Normocephalic and atraumatic.     Nose: Nose normal.     Mouth/Throat:     Mouth: Mucous membranes are dry.  Eyes:     Extraocular Movements: Extraocular movements intact.     Conjunctiva/sclera: Conjunctivae normal.     Pupils: Pupils are equal, round, and reactive to light.  Cardiovascular:     Rate and Rhythm: Tachycardia present. Rhythm irregular.     Pulses: Normal pulses.     Heart sounds: Normal heart sounds.  Pulmonary:     Effort: Pulmonary effort is normal.     Breath sounds: Rales present. No wheezing or rhonchi.  Comments: Posterior right mid to lower lung rales.  Abdominal:     General: Bowel sounds are normal.     Palpations: Abdomen is soft.     Tenderness: There is no abdominal tenderness. There is no right CVA tenderness, left CVA tenderness, guarding or rebound.  Musculoskeletal:     Cervical back: Normal range of motion and neck supple.     Right lower leg: No edema.     Left lower leg: No edema.  Skin:    General: Skin is warm and dry.  Neurological:     General: No focal deficit present.     Mental Status: She is alert and oriented to person, place, and time. Mental status is at baseline.  Psychiatric:        Thought Content: Thought content normal.        Judgment: Judgment normal.     Comments: Lethargic, but able to follow simple directions, answer simple questions     Labs reviewed: Recent Labs    11/02/19 0930 11/06/19 0000 11/09/19 0000  NA 133* 129* 134*  K 4.9 4.8 4.8  CL 99 99 101  CO2 22 22 26*  GLUCOSE 134*  --   --   BUN 35* 35* 25*  CREATININE 0.92 0.8 0.7  CALCIUM 8.8* 8.9 8.8   Recent Labs    11/06/19 0000 11/09/19 0000  AST 84* 41*  ALT 176* 110*  ALKPHOS 92 77  ALBUMIN 3.2* 3.2*   Recent Labs    11/02/19 0930  11/06/19 0000  WBC 9.8 8.8  NEUTROABS 7.6 6,908  HGB 15.7* 46.9*  HCT 47.0* 47*  MCV 97.1  --   PLT 190 180   Lab Results  Component Value Date   TSH 4.64 11/06/2019   No results found for: HGBA1C Lab Results  Component Value Date   CHOL 157 11/06/2019   HDL 20 (A) 11/06/2019   LDLCALC 116 11/06/2019   TRIG 107 11/06/2019    Significant Diagnostic Results in last 30 days:  No results found.  Assessment/Plan: Lethargy lethargy, cough, she is afebrile, follows direction, no O2 desaturation. She denied headache, chest pain/pressure, palpitation, abd pain, dysuria, or urinary urgency.   Stat CBC/diff, CMP/eGFR, UA C/S, CXR ap/lateral.   Liver enzyme elevation Trended down, 11/06/19 AST 84, ALT 176, total bilirubin 0.8(0.2-1.2) alk phos 92(37/153 11/08/19 AST 41, ALT 110, total bilirubin 0.7, alk phols 77 11/21/19 AST 21, ALT 35, total bilirubin 0.3, alk phos 58   Hyponatremia Na 134 last checked.   Edema of both legs Not apparent  Chronic cough Chronic cough, will update CXR in setting of lethargy.   Atrial fibrillation (HCC) Heart rate is in control, continue Diltiazem, Carvedilol, Eliquis.   External hemorrhoids Had BRBPR, negative FOBT, uses Anusol bid, no constipation presently.   Cognitive impairment 7/821 MMSE 24/30, continue Zyprexa, Depakote, GDR if allows.     Family/ staff Communication: plan of care reviewed with the patient and charge nurse.   Labs/tests ordered:  CBC/diff, CMP/eGFR, CXR ap/lateral, UA C/S stat  Time spend 35 minutes.

## 2019-11-25 NOTE — Assessment & Plan Note (Signed)
lethargy, cough, she is afebrile, follows direction, no O2 desaturation. She denied headache, chest pain/pressure, palpitation, abd pain, dysuria, or urinary urgency.   Stat CBC/diff, CMP/eGFR, UA C/S, CXR ap/lateral.

## 2019-11-25 NOTE — Assessment & Plan Note (Signed)
7/821 MMSE 24/30, continue Zyprexa, Depakote, GDR if allows.

## 2019-11-25 NOTE — Assessment & Plan Note (Addendum)
Heart rate is in control, continue Diltiazem, Carvedilol, Eliquis.

## 2019-11-25 NOTE — Assessment & Plan Note (Addendum)
Trended down, 11/06/19 AST 84, ALT 176, total bilirubin 0.8(0.2-1.2) alk phos 92(37/153 11/08/19 AST 41, ALT 110, total bilirubin 0.7, alk phols 77 11/21/19 AST 21, ALT 35, total bilirubin 0.3, alk phos 58

## 2019-11-25 NOTE — Assessment & Plan Note (Signed)
Chronic cough, will update CXR in setting of lethargy.

## 2019-11-26 ENCOUNTER — Encounter: Payer: Self-pay | Admitting: Nurse Practitioner

## 2019-11-26 ENCOUNTER — Non-Acute Institutional Stay (SKILLED_NURSING_FACILITY): Payer: Medicare Other | Admitting: Nurse Practitioner

## 2019-11-26 DIAGNOSIS — R627 Adult failure to thrive: Secondary | ICD-10-CM

## 2019-11-26 DIAGNOSIS — I5022 Chronic systolic (congestive) heart failure: Secondary | ICD-10-CM | POA: Diagnosis not present

## 2019-11-26 DIAGNOSIS — R29898 Other symptoms and signs involving the musculoskeletal system: Secondary | ICD-10-CM | POA: Diagnosis not present

## 2019-11-26 DIAGNOSIS — Z7389 Other problems related to life management difficulty: Secondary | ICD-10-CM | POA: Diagnosis not present

## 2019-11-26 DIAGNOSIS — R2681 Unsteadiness on feet: Secondary | ICD-10-CM | POA: Diagnosis not present

## 2019-11-26 DIAGNOSIS — R600 Localized edema: Secondary | ICD-10-CM

## 2019-11-26 DIAGNOSIS — R41841 Cognitive communication deficit: Secondary | ICD-10-CM | POA: Diagnosis not present

## 2019-11-26 DIAGNOSIS — M6281 Muscle weakness (generalized): Secondary | ICD-10-CM | POA: Diagnosis not present

## 2019-11-26 DIAGNOSIS — I480 Paroxysmal atrial fibrillation: Secondary | ICD-10-CM

## 2019-11-26 DIAGNOSIS — E875 Hyperkalemia: Secondary | ICD-10-CM | POA: Diagnosis not present

## 2019-11-26 DIAGNOSIS — J189 Pneumonia, unspecified organism: Secondary | ICD-10-CM | POA: Diagnosis not present

## 2019-11-26 LAB — BASIC METABOLIC PANEL
BUN: 62 — AB (ref 4–21)
CO2: 26 — AB (ref 13–22)
Chloride: 88 — AB (ref 99–108)
Creatinine: 1 (ref 0.5–1.1)
Glucose: 105
Potassium: 5.6 — AB (ref 3.4–5.3)
Sodium: 123 — AB (ref 137–147)

## 2019-11-26 LAB — COMPREHENSIVE METABOLIC PANEL: Calcium: 8.6 — AB (ref 8.7–10.7)

## 2019-11-26 NOTE — Progress Notes (Signed)
Location:   [facility] Room Number: 3 Place of Service:  SNF [phone]) Provider:  Sherrie Mustache, NP  Virgie Dad, MD  Patient Care Team: Virgie Dad, MD as PCP - General (Internal Medicine)  Extended Emergency Contact Information Primary Emergency Contact: Bingley,Robert Address: [place] [address] [address] [place] Phone: [phone] Mobile Phone: [phone] Relation: Spouse Secondary Emergency Contact: Alan Ripper Address: [address]          [place], [place] [address] Home Phone: [phone] Mobile Phone: [phone] Relation: Daughter  Code Status:  DNR Goals of care: Advanced Directive information Advanced Directives 11/26/2019  Does Patient Have a Medical Advance Directive? Yes  Type of Advance Directive Living will  Does patient want to make changes to medical advance directive? No - Patient declined  Copy of [place] in Chart? -  Would patient like information on creating a medical advance directive? -     Chief Complaint  Patient presents with   Acute Visit    Decrease LOC    HPI:  Pt is a 86 y.o. female seen today for an acute visit for decrease LOC, she was [facility] NP yesterday for acute visit due to [place] and cough. Nursing staff reports she has progressively gotten worse at this time. Family has opted not to send her to the hospital. Her MOST form reads comfort measures.  She has a hx of CHF, dementia, elevated liver ezymes and hyponatremia.  Labs reviewed which revealed worsening hyponatremia, elevated potassium, elevated BUN and Cr, low albumin and protein, elevated WBC, hgb and hct. Chest xray was consistent with pneumonia and pleural effusions noted bilaterally. Results called to on call who started rocephin IM and gave kayexalate x 2 due to elevated potassium.   Today she has been staying in bed, she opens eyes slightly to voice and follows simple commands.  Staff reports she is drinking but not eating. She has worsening edema and requiring O2 at 2L to keep sats over 90s.  Past Medical History:  Diagnosis Date   Abdominal pain, lower 06/20/2015   Atrial fibrillation (HCC)    CAD (coronary artery disease) of artery bypass graft 06/25/2015   Chronic anticoagulation    Chronic cough 11/03/2011   Debility 06/25/2015   Dyspnea 06/25/2015   Edema of both legs 06/25/2015   07/07/15 BNP 363.7    Hemorrhoids    High cholesterol    Hypokalemia 07/01/2015   07/07/15 K 4.8    Hyponatremia 06/25/2015   07/07/15 Na 134    IBS (irritable bowel syndrome) 06/25/2015   LBBB (left bundle branch block)    Nonsustained ventricular tachycardia ([place]) 06/25/2015   Pneumonia 10/06/3014   Systolic CHF ([place]) 0/01/9322   07/07/15 BNP 557.3    Systolic heart failure ([place])    Weakness generalized 06/25/2015   Past Surgical History:  Procedure Laterality Date   cataracts     TONSILLECTOMY AND ADENOIDECTOMY      Allergies  Allergen Reactions   Aspirin     Makes her feel unpleasant   Lidocaine Other (See Comments)    Reaction unknown   Mevacor [Lovastatin] Other (See Comments)    Reaction unknown   Penicillins Other (See Comments)    "Patient unable to remember reaction"   Sulfa Antibiotics     " Patient unable to remember reaction"    Allergies as of 11/26/2019  Reactions   Aspirin    Makes her feel unpleasant   Lidocaine Other (See Comments)   Reaction unknown   Mevacor [lovastatin] Other (See Comments)   Reaction unknown   Penicillins Other (See Comments)   "Patient unable to remember reaction"   Sulfa Antibiotics    " Patient unable to remember reaction"      Medication List       Accurate as of November 26, 2019  4:04 PM. If you have any questions, ask your nurse or doctor.        Anusol-HC 2.5 % rectal cream Generic drug: hydrocortisone Apply 1 application topically 2 (two) times daily. one application, topical, Twice A Day    Anusol-HC (hydrocortisone) 2.5 % cream with perineal applicator   carvedilol 6.25 MG tablet Commonly known as: COREG Take 6.25 mg by mouth 2 (two) times daily with a meal.   cefTRIAXone  IVPB Commonly known as: ROCEPHIN Inject 1 g into the vein daily.   CENTRUM SILVER PO Take 1 tablet by mouth daily.   diltiazem 120 MG 24 hr capsule Commonly known as: CARDIZEM CD Take 120 mg by mouth daily.   divalproex 125 MG capsule Commonly known as: DEPAKOTE SPRINKLE Take 125 mg by mouth 2 (two) times daily.   Eliquis 5 MG Tabs tablet Generic drug: apixaban Take 5 mg by mouth 2 (two) times daily.   OLANZapine 5 MG tablet Commonly known as: ZYPREXA Take 2.5 mg by mouth at bedtime.   rosuvastatin 5 MG tablet Commonly known as: CRESTOR Take 5 mg by mouth daily.   sodium polystyrene powder Commonly known as: KAYEXALATE Take 15 g by mouth once.   VITAMIN D3 PO Take 1 tablet by mouth daily. Vitamin D3 200IU oral tablet       Review of Systems  Unable to perform ROS: Acuity of condition    Immunization History  Administered Date(s) Administered   Influenza Whole 01/01/2011   Pneumococcal-Unspecified 01/26/2010   Unspecified SARS-COV-2 Vaccination 05/06/2019, 06/03/2019   Pertinent  Health Maintenance Due  Topic Date Due   DEXA SCAN  Never done   PNA vac Low Risk Adult (2 of 2 - PCV13) 01/27/2011   INFLUENZA VACCINE  12/01/2019   No flowsheet data found. Functional Status Survey:    Vitals:   11/26/19 1552  BP: 110/72  Pulse: 80  Resp: 22  Temp: (!) 97.1 F (36.2 C)  SpO2: (!) 89%  Weight: 155 lb 4.8 oz (70.4 kg)  Height: 5\' 3"  (1.6 m)   Body mass index is 27.51 kg/m. Physical Exam Constitutional:      General: She is not in acute distress.    Appearance: She is well-developed. She is not diaphoretic.  HENT:     Head: Normocephalic and atraumatic.     Mouth/Throat:     Mouth: Mucous membranes are dry.  Eyes:     Conjunctiva/sclera: Conjunctivae  normal.     Pupils: Pupils are equal, round, and reactive to light.  Cardiovascular:     Rate and Rhythm: Tachycardia present. Rhythm irregularly irregular.     Heart sounds: Normal heart sounds.  Pulmonary:     Effort: Pulmonary effort is normal. Tachypnea present. No respiratory distress.     Breath sounds: Decreased air movement present. Decreased breath sounds present.  Abdominal:     General: Bowel sounds are normal.     Palpations: Abdomen is soft.  Musculoskeletal:        General: No tenderness.  Cervical back: Normal range of motion and neck supple.     Right lower leg: Edema (2+) present.     Left lower leg: Edema (2+) present.  Skin:    General: Skin is warm and dry.  Neurological:     Mental Status: She is lethargic.     Motor: Weakness present.     Labs reviewed: Recent Labs    11/02/19 0930 11/06/19 0000 11/09/19 0000 11/25/19 0000 11/26/19 0000  NA 133*   < > 134* 123* 123*  K 4.9   < > 4.8 6.1* 5.6*  CL 99   < > 101 90* 88*  CO2 22   < > 26* 25* 26*  GLUCOSE 134*  --   --   --   --   BUN 35*   < > 25* 60* 62*  CREATININE 0.92   < > 0.7 1.0 1.0  CALCIUM 8.8*   < > 8.8 8.8 8.6*   < > = values in this interval not displayed.   Recent Labs    11/09/19 0000 11/21/19 0000 11/25/19 0000  AST 41* 21 21  ALT 110* 35 34  ALKPHOS 77 58 64  ALBUMIN 3.2* 3.4* 5.2*   Recent Labs    11/02/19 0930 11/02/19 0930 11/06/19 0000 11/21/19 0000 11/25/19 0000  WBC 9.8  --  8.8 10.4 13.1  NEUTROABS 7.6  --  6,908 8,226 10,939  HGB 15.7*   < > 46.9* 16.6* 17.6*  HCT 47.0*   < > 47* 49* 53*  MCV 97.1  --   --   --   --   PLT 190  --  180  --  174   < > = values in this interval not displayed.   Lab Results  Component Value Date   TSH 4.64 11/06/2019   No results found for: HGBA1C Lab Results  Component Value Date   CHOL 157 11/06/2019   HDL 20 (A) 11/06/2019   LDLCALC 116 11/06/2019   TRIG 107 11/06/2019    Significant Diagnostic Results in last  30 days:  No results found.  Assessment/Plan 1. Pneumonia due to infectious organism, unspecified laterality, unspecified part of lung Pt was started on IM rocephin 1 gm daily for 7 days. However with decrease LOC and minimal PO intake. Requiring O2 at this time to keep Oxygen level over 90% she is conformable at this time.   2. Edema of both legs Progressive worsening LE edema of bilateral legs.  3. Paroxysmal atrial fibrillation (HCC) On coreg and eliquis for anticoagulation but tachycardic at this time.  4. Chronic systolic congestive heart failure ([place]) -with pleural effusions and worsening LE edema however intervascularly dry. Daughter has been called to discuss this and she does not wish to be aggressive at this time.  5. Failure to thrive in adult Pt with low protein and albumin on labs, increase edema noted with decrease oral intake now with fatigue and decrease LOC. Called daughter and updated her of all the lab and imaging. She does not wish to be aggressive with care per her mother (the patients) wish, does not want her sent to the hospital, does not want IV fluid or any additional labs at this time. Discussed hospice consult and order placed  Updated staff on conversation and [place], NP   Carlos American. [place], [facility] [phone]

## 2019-11-27 ENCOUNTER — Non-Acute Institutional Stay (SKILLED_NURSING_FACILITY): Payer: Medicare Other | Admitting: Nurse Practitioner

## 2019-11-27 ENCOUNTER — Encounter: Payer: Self-pay | Admitting: Nurse Practitioner

## 2019-11-27 DIAGNOSIS — R4189 Other symptoms and signs involving cognitive functions and awareness: Secondary | ICD-10-CM | POA: Diagnosis not present

## 2019-11-27 DIAGNOSIS — R627 Adult failure to thrive: Secondary | ICD-10-CM

## 2019-11-27 DIAGNOSIS — J189 Pneumonia, unspecified organism: Secondary | ICD-10-CM | POA: Diagnosis not present

## 2019-11-27 NOTE — Progress Notes (Signed)
Location:    [facility] Room Number: 3 Place of Service:  SNF (31) Provider: Lennie Odor Yuka Lallier NP  Virgie Dad, MD  Patient Care Team: Virgie Dad, MD as PCP - General (Internal Medicine)  Extended Emergency Contact Information Primary Emergency Contact: Beadles,Robert Address: [place] [address] [address] of [place] Phone: [phone] Mobile Phone: [phone] Relation: Spouse Secondary Emergency Contact: Alan Ripper Address: [address]          [place], [place] [address] Home Phone: [phone] Mobile Phone: [phone] Relation: Daughter  Code Status: DNR Goals of care: Advanced Directive information Advanced Directives 11/26/2019  Does Patient Have a Medical Advance Directive? Yes  Type of Advance Directive Living will  Does patient want to make changes to medical advance directive? No - Patient declined  Copy of [place] in Chart? -  Would patient like information on creating a medical advance directive? -     Chief Complaint  Patient presents with  . Acute Visit    End of life care    HPI:  Pt is a 86 y.o. female seen today for an acute visit for deconditioning.   11/26/19 Na 123, K 5.6, Bun 62, creat 0.99, eGFR 51 s/p Kayexalate. HOPA daughter: dc all po Med, no IVF/ABT, comfort measures only, will have morphine '5mg'$  q2h prn, Lorazepam '1mg'$  q2hr prn, Bisacodyl '10mg'$  suppository pr q3 days prn, O2 for comfort measures.   Hx of CHF, dementia, Pneumonia started Rocephin 11/25/19, Kayexalate x2 for elevated serum K.    Past Medical History:  Diagnosis Date  . Abdominal pain, lower 06/20/2015  . Atrial fibrillation ([place])   . CAD (coronary artery disease) of artery bypass graft 06/25/2015  . Chronic anticoagulation   . Chronic cough 11/03/2011  . Debility 06/25/2015  . Dyspnea 06/25/2015  . Edema of both legs 06/25/2015   07/07/15 BNP 363.7   . Hemorrhoids   . High cholesterol   .  Hypokalemia 07/01/2015   07/07/15 K 4.8   . Hyponatremia 06/25/2015   07/07/15 Na 134   . IBS (irritable bowel syndrome) 06/25/2015  . LBBB (left bundle branch block)   . Nonsustained ventricular tachycardia ([place]) 06/25/2015  . Pneumonia 06/25/2015  . Systolic CHF ([place]) 05/04/7251   07/07/15 BNP 363.7   . Systolic heart failure ([place])   . Weakness generalized 06/25/2015   Past Surgical History:  Procedure Laterality Date  . cataracts    . TONSILLECTOMY AND ADENOIDECTOMY      Allergies  Allergen Reactions  . Aspirin     Makes her feel unpleasant  . Lidocaine Other (See Comments)    Reaction unknown  . Mevacor [Lovastatin] Other (See Comments)    Reaction unknown  . Penicillins Other (See Comments)    "Patient unable to remember reaction"  . Sulfa Antibiotics     " Patient unable to remember reaction"    Allergies as of 11/27/2019      Reactions   Aspirin    Makes her feel unpleasant   Lidocaine Other (See Comments)   Reaction unknown   Mevacor [lovastatin] Other (See Comments)   Reaction unknown   Penicillins Other (See Comments)   "Patient unable to remember reaction"   Sulfa Antibiotics    " Patient unable to remember reaction"      Medication List       Accurate as of November 27, 2019 11:59 PM. If  you have any questions, ask your nurse or doctor.        STOP taking these medications   carvedilol 6.25 MG tablet Commonly known as: COREG Stopped by: Monterrio Gerst X Cecillia Menees, NP   cefTRIAXone  IVPB Commonly known as: ROCEPHIN Stopped by: Cherisse Carrell X Alanya Vukelich, NP   CENTRUM SILVER PO Stopped by: Keavon Sensing X Zubin Pontillo, NP   diltiazem 120 MG 24 hr capsule Commonly known as: CARDIZEM CD Stopped by: Casimira Sutphin X Candita Borenstein, NP   divalproex 125 MG capsule Commonly known as: DEPAKOTE SPRINKLE Stopped by: Arlando Leisinger X Tyquasia Pant, NP   Eliquis 5 MG Tabs tablet Generic drug: apixaban Stopped by: Jarel Cuadra X Brysten Reister, NP   OLANZapine 5 MG tablet Commonly known as: ZYPREXA Stopped by: Forrest Jaroszewski X Seba Madole, NP   rosuvastatin 5 MG tablet Commonly  known as: CRESTOR Stopped by: Phelix Fudala X Jood Retana, NP   sodium polystyrene powder Commonly known as: KAYEXALATE Stopped by: Kea Callan X Mickenzie Stolar, NP   VITAMIN D3 PO Stopped by: Navika Hoopes X Santina Trillo, NP     TAKE these medications   Anusol-HC 2.5 % rectal cream Generic drug: hydrocortisone Apply 1 application topically 2 (two) times daily. one application, topical, Twice A Day   Anusol-HC (hydrocortisone) 2.5 % cream with perineal applicator       Review of Systems  Constitutional: Positive for activity change, appetite change and fatigue. Negative for fever.       Lethargic  HENT: Positive for hearing loss. Negative for congestion, trouble swallowing and voice change.   Eyes: Negative for visual disturbance.  Respiratory: Positive for cough and shortness of breath. Negative for chest tightness and wheezing.   Cardiovascular: Negative for chest pain, palpitations and leg swelling.  Gastrointestinal: Negative for abdominal pain, constipation, nausea and vomiting.  Genitourinary: Negative for difficulty urinating, dysuria, frequency, hematuria and urgency.  Musculoskeletal: Positive for gait problem.  Skin: Negative for color change.  Neurological: Negative for facial asymmetry, speech difficulty, weakness, light-headedness and headaches.  Psychiatric/Behavioral: Negative for behavioral problems and sleep disturbance. The patient is not nervous/anxious.     Immunization History  Administered Date(s) Administered  . Influenza Whole 01/01/2011  . Pneumococcal-Unspecified 01/26/2010  . Unspecified SARS-COV-2 Vaccination 05/06/2019, 06/03/2019   Pertinent  Health Maintenance Due  Topic Date Due  . DEXA SCAN  Never done  . PNA vac Low Risk Adult (2 of 2 - PCV13) 01/27/2011  . INFLUENZA VACCINE  12/01/2019   No flowsheet data found. Functional Status Survey:    Vitals:   11/27/19 1228  BP: 110/72  Pulse: 80  Resp: 22  Temp: 98 F (36.7 C)  SpO2: 91%  Weight: 155 lb 4.8 oz (70.4 kg)  Height: '5\' 3"'$   (1.6 m)   Body mass index is 27.51 kg/m. Physical Exam Vitals and nursing note reviewed.  Constitutional:      Comments: lethargic  HENT:     Head: Normocephalic and atraumatic.     Mouth/Throat:     Mouth: Mucous membranes are dry.  Eyes:     Extraocular Movements: Extraocular movements intact.     Conjunctiva/sclera: Conjunctivae normal.     Pupils: Pupils are equal, round, and reactive to light.  Cardiovascular:     Rate and Rhythm: Tachycardia present. Rhythm irregular.  Pulmonary:     Effort: Pulmonary effort is normal.     Breath sounds: Rales present. No wheezing or rhonchi.     Comments: Posterior right mid to lower lung rales.  Abdominal:     General: Bowel sounds are normal.  Palpations: Abdomen is soft.     Tenderness: There is no abdominal tenderness. There is no guarding or rebound.  Musculoskeletal:     Cervical back: Normal range of motion and neck supple.     Right lower leg: No edema.     Left lower leg: No edema.  Skin:    General: Skin is warm and dry.  Neurological:     General: No focal deficit present.     Mental Status: She is alert and oriented to person, place, and time. Mental status is at baseline.  Psychiatric:     Comments: Lethargic, but able to follow simple directions, answer simple questions     Labs reviewed: Recent Labs    11/02/19 0930 11/06/19 0000 11/09/19 0000 11/25/19 0000 11/26/19 0000  NA 133*   < > 134* 123* 123*  K 4.9   < > 4.8 6.1* 5.6*  CL 99   < > 101 90* 88*  CO2 22   < > 26* 25* 26*  GLUCOSE 134*  --   --   --   --   BUN 35*   < > 25* 60* 62*  CREATININE 0.92   < > 0.7 1.0 1.0  CALCIUM 8.8*   < > 8.8 8.8 8.6*   < > = values in this interval not displayed.   Recent Labs    11/09/19 0000 11/21/19 0000 11/25/19 0000  AST 41* 21 21  ALT 110* 35 34  ALKPHOS 77 58 64  ALBUMIN 3.2* 3.4* 5.2*   Recent Labs    11/02/19 0930 11/02/19 0930 11/06/19 0000 11/21/19 0000 11/25/19 0000  WBC 9.8  --  8.8  10.4 13.1  NEUTROABS 7.6  --  6,908 8,226 10,939  HGB 15.7*   < > 46.9* 16.6* 17.6*  HCT 47.0*   < > 47* 49* 53*  MCV 97.1  --   --   --   --   PLT 190  --  180  --  174   < > = values in this interval not displayed.   Lab Results  Component Value Date   TSH 4.64 11/06/2019   No results found for: HGBA1C Lab Results  Component Value Date   CHOL 157 11/06/2019   HDL 20 (A) 11/06/2019   LDLCALC 116 11/06/2019   TRIG 107 11/06/2019    Significant Diagnostic Results in last 30 days:  No results found.  Assessment/Plan: Failure to thrive in adult 11/26/19 Na 123, K 5.6, Bun 62, creat 0.99, eGFR 51 s/p Kayexalate. HOPA daughter: dc all po Med, no IVF/ABT, comfort measures only, will have morphine '5mg'$  q2h prn, Lorazepam '1mg'$  q2hr prn, Bisacodyl '10mg'$  suppository pr q3 days prn, O2 for comfort measures.   Pneumonia HPOA requested to stop taking ABT, O2 for comfort purpose  Cognitive impairment Supportive care in SNF [facility], comfort measures is her goal of care    Family/ staff Communication: plan of care reviewed with the patient and charge nurse.   Labs/tests ordered:  No  Time spend 25 minutes.

## 2019-11-27 NOTE — Assessment & Plan Note (Addendum)
11/26/19 Na 123, K 5.6, Bun 62, creat 0.99, eGFR 51 s/p Kayexalate. HOPA daughter: dc all po Med, no IVF/ABT, comfort measures only, will have morphine '5mg'$  q2h prn, Lorazepam '1mg'$  q2hr prn, Bisacodyl '10mg'$  suppository pr q3 days prn, O2 for comfort measures.

## 2019-11-28 ENCOUNTER — Encounter: Payer: Self-pay | Admitting: Nurse Practitioner

## 2019-11-28 DIAGNOSIS — E785 Hyperlipidemia, unspecified: Secondary | ICD-10-CM | POA: Diagnosis not present

## 2019-11-28 DIAGNOSIS — I447 Left bundle-branch block, unspecified: Secondary | ICD-10-CM | POA: Diagnosis not present

## 2019-11-28 DIAGNOSIS — E871 Hypo-osmolality and hyponatremia: Secondary | ICD-10-CM | POA: Diagnosis not present

## 2019-11-28 DIAGNOSIS — I2510 Atherosclerotic heart disease of native coronary artery without angina pectoris: Secondary | ICD-10-CM | POA: Diagnosis not present

## 2019-11-28 DIAGNOSIS — G3184 Mild cognitive impairment, so stated: Secondary | ICD-10-CM | POA: Diagnosis not present

## 2019-11-28 DIAGNOSIS — I5022 Chronic systolic (congestive) heart failure: Secondary | ICD-10-CM | POA: Diagnosis not present

## 2019-11-28 DIAGNOSIS — Z6827 Body mass index (BMI) 27.0-27.9, adult: Secondary | ICD-10-CM | POA: Diagnosis not present

## 2019-11-28 DIAGNOSIS — I480 Paroxysmal atrial fibrillation: Secondary | ICD-10-CM | POA: Diagnosis not present

## 2019-11-28 DIAGNOSIS — K649 Unspecified hemorrhoids: Secondary | ICD-10-CM | POA: Diagnosis not present

## 2019-11-29 DIAGNOSIS — E785 Hyperlipidemia, unspecified: Secondary | ICD-10-CM | POA: Diagnosis not present

## 2019-11-29 DIAGNOSIS — I2510 Atherosclerotic heart disease of native coronary artery without angina pectoris: Secondary | ICD-10-CM | POA: Diagnosis not present

## 2019-11-29 DIAGNOSIS — I5022 Chronic systolic (congestive) heart failure: Secondary | ICD-10-CM | POA: Diagnosis not present

## 2019-11-29 DIAGNOSIS — I447 Left bundle-branch block, unspecified: Secondary | ICD-10-CM | POA: Diagnosis not present

## 2019-11-29 DIAGNOSIS — E871 Hypo-osmolality and hyponatremia: Secondary | ICD-10-CM | POA: Diagnosis not present

## 2019-11-29 DIAGNOSIS — I480 Paroxysmal atrial fibrillation: Secondary | ICD-10-CM | POA: Diagnosis not present

## 2019-11-30 DIAGNOSIS — I2510 Atherosclerotic heart disease of native coronary artery without angina pectoris: Secondary | ICD-10-CM | POA: Diagnosis not present

## 2019-11-30 DIAGNOSIS — I480 Paroxysmal atrial fibrillation: Secondary | ICD-10-CM | POA: Diagnosis not present

## 2019-11-30 DIAGNOSIS — I5022 Chronic systolic (congestive) heart failure: Secondary | ICD-10-CM | POA: Diagnosis not present

## 2019-11-30 DIAGNOSIS — E871 Hypo-osmolality and hyponatremia: Secondary | ICD-10-CM | POA: Diagnosis not present

## 2019-11-30 DIAGNOSIS — E785 Hyperlipidemia, unspecified: Secondary | ICD-10-CM | POA: Diagnosis not present

## 2019-11-30 DIAGNOSIS — I447 Left bundle-branch block, unspecified: Secondary | ICD-10-CM | POA: Diagnosis not present

## 2019-12-01 ENCOUNTER — Encounter: Payer: Self-pay | Admitting: Nurse Practitioner

## 2019-12-01 DIAGNOSIS — G3184 Mild cognitive impairment, so stated: Secondary | ICD-10-CM | POA: Diagnosis not present

## 2019-12-01 DIAGNOSIS — Z6827 Body mass index (BMI) 27.0-27.9, adult: Secondary | ICD-10-CM | POA: Diagnosis not present

## 2019-12-01 DIAGNOSIS — E871 Hypo-osmolality and hyponatremia: Secondary | ICD-10-CM | POA: Diagnosis not present

## 2019-12-01 DIAGNOSIS — I5022 Chronic systolic (congestive) heart failure: Secondary | ICD-10-CM | POA: Diagnosis not present

## 2019-12-01 DIAGNOSIS — E785 Hyperlipidemia, unspecified: Secondary | ICD-10-CM | POA: Diagnosis not present

## 2019-12-01 DIAGNOSIS — I480 Paroxysmal atrial fibrillation: Secondary | ICD-10-CM | POA: Diagnosis not present

## 2019-12-01 DIAGNOSIS — I2510 Atherosclerotic heart disease of native coronary artery without angina pectoris: Secondary | ICD-10-CM | POA: Diagnosis not present

## 2019-12-01 DIAGNOSIS — K649 Unspecified hemorrhoids: Secondary | ICD-10-CM | POA: Diagnosis not present

## 2019-12-01 DIAGNOSIS — I447 Left bundle-branch block, unspecified: Secondary | ICD-10-CM | POA: Diagnosis not present

## 2019-12-01 NOTE — Assessment & Plan Note (Signed)
HPOA requested to stop taking ABT, O2 for comfort purpose

## 2019-12-01 NOTE — Assessment & Plan Note (Signed)
Supportive care in SNF [facility], comfort measures is her goal of care

## 2019-12-02 DIAGNOSIS — I2510 Atherosclerotic heart disease of native coronary artery without angina pectoris: Secondary | ICD-10-CM | POA: Diagnosis not present

## 2019-12-02 DIAGNOSIS — I5022 Chronic systolic (congestive) heart failure: Secondary | ICD-10-CM | POA: Diagnosis not present

## 2019-12-02 DIAGNOSIS — E871 Hypo-osmolality and hyponatremia: Secondary | ICD-10-CM | POA: Diagnosis not present

## 2019-12-02 DIAGNOSIS — E785 Hyperlipidemia, unspecified: Secondary | ICD-10-CM | POA: Diagnosis not present

## 2019-12-02 DIAGNOSIS — I447 Left bundle-branch block, unspecified: Secondary | ICD-10-CM | POA: Diagnosis not present

## 2019-12-02 DIAGNOSIS — I480 Paroxysmal atrial fibrillation: Secondary | ICD-10-CM | POA: Diagnosis not present

## 2019-12-03 DIAGNOSIS — I447 Left bundle-branch block, unspecified: Secondary | ICD-10-CM | POA: Diagnosis not present

## 2019-12-03 DIAGNOSIS — I480 Paroxysmal atrial fibrillation: Secondary | ICD-10-CM | POA: Diagnosis not present

## 2019-12-03 DIAGNOSIS — I5022 Chronic systolic (congestive) heart failure: Secondary | ICD-10-CM | POA: Diagnosis not present

## 2019-12-03 DIAGNOSIS — E785 Hyperlipidemia, unspecified: Secondary | ICD-10-CM | POA: Diagnosis not present

## 2019-12-03 DIAGNOSIS — I2510 Atherosclerotic heart disease of native coronary artery without angina pectoris: Secondary | ICD-10-CM | POA: Diagnosis not present

## 2019-12-03 DIAGNOSIS — E871 Hypo-osmolality and hyponatremia: Secondary | ICD-10-CM | POA: Diagnosis not present

## 2019-12-07 DIAGNOSIS — I5022 Chronic systolic (congestive) heart failure: Secondary | ICD-10-CM | POA: Diagnosis not present

## 2019-12-07 DIAGNOSIS — I480 Paroxysmal atrial fibrillation: Secondary | ICD-10-CM | POA: Diagnosis not present

## 2019-12-07 DIAGNOSIS — I447 Left bundle-branch block, unspecified: Secondary | ICD-10-CM | POA: Diagnosis not present

## 2019-12-07 DIAGNOSIS — E871 Hypo-osmolality and hyponatremia: Secondary | ICD-10-CM | POA: Diagnosis not present

## 2019-12-07 DIAGNOSIS — E785 Hyperlipidemia, unspecified: Secondary | ICD-10-CM | POA: Diagnosis not present

## 2019-12-07 DIAGNOSIS — I2510 Atherosclerotic heart disease of native coronary artery without angina pectoris: Secondary | ICD-10-CM | POA: Diagnosis not present

## 2019-12-10 DIAGNOSIS — I5022 Chronic systolic (congestive) heart failure: Secondary | ICD-10-CM | POA: Diagnosis not present

## 2019-12-10 DIAGNOSIS — I480 Paroxysmal atrial fibrillation: Secondary | ICD-10-CM | POA: Diagnosis not present

## 2019-12-10 DIAGNOSIS — I2510 Atherosclerotic heart disease of native coronary artery without angina pectoris: Secondary | ICD-10-CM | POA: Diagnosis not present

## 2019-12-10 DIAGNOSIS — E871 Hypo-osmolality and hyponatremia: Secondary | ICD-10-CM | POA: Diagnosis not present

## 2019-12-10 DIAGNOSIS — I447 Left bundle-branch block, unspecified: Secondary | ICD-10-CM | POA: Diagnosis not present

## 2019-12-10 DIAGNOSIS — E785 Hyperlipidemia, unspecified: Secondary | ICD-10-CM | POA: Diagnosis not present

## 2019-12-11 DIAGNOSIS — I2510 Atherosclerotic heart disease of native coronary artery without angina pectoris: Secondary | ICD-10-CM | POA: Diagnosis not present

## 2019-12-11 DIAGNOSIS — E785 Hyperlipidemia, unspecified: Secondary | ICD-10-CM | POA: Diagnosis not present

## 2019-12-11 DIAGNOSIS — I480 Paroxysmal atrial fibrillation: Secondary | ICD-10-CM | POA: Diagnosis not present

## 2019-12-11 DIAGNOSIS — I447 Left bundle-branch block, unspecified: Secondary | ICD-10-CM | POA: Diagnosis not present

## 2019-12-11 DIAGNOSIS — I5022 Chronic systolic (congestive) heart failure: Secondary | ICD-10-CM | POA: Diagnosis not present

## 2019-12-11 DIAGNOSIS — E871 Hypo-osmolality and hyponatremia: Secondary | ICD-10-CM | POA: Diagnosis not present

## 2019-12-13 DIAGNOSIS — I447 Left bundle-branch block, unspecified: Secondary | ICD-10-CM | POA: Diagnosis not present

## 2019-12-13 DIAGNOSIS — I480 Paroxysmal atrial fibrillation: Secondary | ICD-10-CM | POA: Diagnosis not present

## 2019-12-13 DIAGNOSIS — I2510 Atherosclerotic heart disease of native coronary artery without angina pectoris: Secondary | ICD-10-CM | POA: Diagnosis not present

## 2019-12-13 DIAGNOSIS — E785 Hyperlipidemia, unspecified: Secondary | ICD-10-CM | POA: Diagnosis not present

## 2019-12-13 DIAGNOSIS — I5022 Chronic systolic (congestive) heart failure: Secondary | ICD-10-CM | POA: Diagnosis not present

## 2019-12-13 DIAGNOSIS — E871 Hypo-osmolality and hyponatremia: Secondary | ICD-10-CM | POA: Diagnosis not present

## 2019-12-17 DIAGNOSIS — E871 Hypo-osmolality and hyponatremia: Secondary | ICD-10-CM | POA: Diagnosis not present

## 2019-12-17 DIAGNOSIS — I5022 Chronic systolic (congestive) heart failure: Secondary | ICD-10-CM | POA: Diagnosis not present

## 2019-12-17 DIAGNOSIS — I2510 Atherosclerotic heart disease of native coronary artery without angina pectoris: Secondary | ICD-10-CM | POA: Diagnosis not present

## 2019-12-17 DIAGNOSIS — I480 Paroxysmal atrial fibrillation: Secondary | ICD-10-CM | POA: Diagnosis not present

## 2019-12-17 DIAGNOSIS — I447 Left bundle-branch block, unspecified: Secondary | ICD-10-CM | POA: Diagnosis not present

## 2019-12-17 DIAGNOSIS — E785 Hyperlipidemia, unspecified: Secondary | ICD-10-CM | POA: Diagnosis not present

## 2019-12-18 ENCOUNTER — Encounter: Payer: Self-pay | Admitting: Nurse Practitioner

## 2019-12-18 ENCOUNTER — Non-Acute Institutional Stay (SKILLED_NURSING_FACILITY): Payer: Medicare Other | Admitting: Nurse Practitioner

## 2019-12-18 DIAGNOSIS — I480 Paroxysmal atrial fibrillation: Secondary | ICD-10-CM | POA: Diagnosis not present

## 2019-12-18 DIAGNOSIS — E871 Hypo-osmolality and hyponatremia: Secondary | ICD-10-CM | POA: Diagnosis not present

## 2019-12-18 DIAGNOSIS — J189 Pneumonia, unspecified organism: Secondary | ICD-10-CM | POA: Diagnosis not present

## 2019-12-18 DIAGNOSIS — I447 Left bundle-branch block, unspecified: Secondary | ICD-10-CM | POA: Diagnosis not present

## 2019-12-18 DIAGNOSIS — R627 Adult failure to thrive: Secondary | ICD-10-CM

## 2019-12-18 DIAGNOSIS — I2510 Atherosclerotic heart disease of native coronary artery without angina pectoris: Secondary | ICD-10-CM | POA: Diagnosis not present

## 2019-12-18 DIAGNOSIS — I5022 Chronic systolic (congestive) heart failure: Secondary | ICD-10-CM | POA: Diagnosis not present

## 2019-12-18 DIAGNOSIS — R4189 Other symptoms and signs involving cognitive functions and awareness: Secondary | ICD-10-CM | POA: Diagnosis not present

## 2019-12-18 DIAGNOSIS — E785 Hyperlipidemia, unspecified: Secondary | ICD-10-CM | POA: Diagnosis not present

## 2019-12-18 NOTE — Assessment & Plan Note (Signed)
Persisted, comfort measures, continue prn Morphine, Lorazepam.

## 2019-12-18 NOTE — Assessment & Plan Note (Addendum)
Heart rate is not controlled, the patient denied chest pain/pressure, or palpitation. She appeared calm, smiled during my examination.

## 2019-12-18 NOTE — Assessment & Plan Note (Signed)
Compensated clinically.  

## 2019-12-18 NOTE — Progress Notes (Signed)
Location:   [place] Room Number: 3Room Number 3 Place of Service:  SNF (31)SNF Provider: [facility] Tinya Cadogan NP  Virgie Dad, MD  Patient Care Team: Virgie Dad, MD as PCP - General (Internal Medicine)  Extended Emergency Contact Information Primary Emergency Contact: Grothaus,Robert Address: [place] [address] [address] [place] Phone: [phone] Mobile Phone: [phone] Relation: Spouse Secondary Emergency Contact: Alan Ripper Address: [address]          [place], [place] [address] Home Phone: [phone] Mobile Phone: [phone] Relation: Daughter  Code Status:  DNR Goals of care: Advanced Directive information Advanced Directives 12/18/2019  Does Patient Have a Medical Advance Directive? Yes  Type of Advance Directive Living will;Healthcare Power of Attorney  Does patient want to make changes to medical advance directive? No - Patient declined  Copy of [place] in Chart? Yes - validated most recent copy scanned in chart (See row information)  Would patient like information on creating a medical advance directive? -     Chief Complaint  Patient presents with  . Medical Management of Chronic Issues    Routine follow up visit  . Best Practice Recommendations    Tetanus/Tdap, pneumonia and flu vaccine  . Quality Metric Gaps    DEXA scan    HPI:  Pt is a 86 y.o. female seen today for medical management of chronic diseases.     Adult FTT, 11/26/19 HOPA daughter: dc all po Med, no IVF/ABT, comfort measures only, morphine 5mg  q2h prn, Lorazepam 1mg  q2hr prn, Bisacodyl 10mg  suppository pr q3 days prn, O2 for comfort measures.              Hx of CHF, Afib, dementia, Pneumonia.      Past Medical History:  Diagnosis Date  . Abdominal pain, lower 06/20/2015  . Atrial fibrillation ([place])   . CAD (coronary artery disease) of artery bypass graft 06/25/2015  . Chronic anticoagulation     . Chronic cough 11/03/2011  . Debility 06/25/2015  . Dyspnea 06/25/2015  . Edema of both legs 06/25/2015   07/07/15 BNP 363.7   . Hemorrhoids   . High cholesterol   . Hypokalemia 07/01/2015   07/07/15 K 4.8   . Hyponatremia 06/25/2015   07/07/15 Na 134   . IBS (irritable bowel syndrome) 06/25/2015  . LBBB (left bundle branch block)   . Nonsustained ventricular tachycardia ([place]) 06/25/2015  . Pneumonia 06/25/2015  . Systolic CHF ([place]) 06/21/2540   07/07/15 BNP 363.7   . Systolic heart failure ([place])   . Weakness generalized 06/25/2015   Past Surgical History:  Procedure Laterality Date  . cataracts    . TONSILLECTOMY AND ADENOIDECTOMY      Allergies  Allergen Reactions  . Aspirin     Makes her feel unpleasant  . Lidocaine Other (See Comments)    Reaction unknown  . Mevacor [Lovastatin] Other (See Comments)    Reaction unknown  . Penicillins Other (See Comments)    "Patient unable to remember reaction"  . Sulfa Antibiotics     " Patient unable to remember reaction"    Allergies as of 12/18/2019      Reactions   Aspirin    Makes her feel unpleasant   Lidocaine Other (See Comments)   Reaction unknown   Mevacor [lovastatin] Other (See Comments)   Reaction unknown   Penicillins Other (See Comments)   "Patient unable  to remember reaction"   Sulfa Antibiotics    " Patient unable to remember reaction"      Medication List       Accurate as of December 18, 2019 11:59 PM. If you have any questions, ask your nurse or doctor.        Anusol-HC 2.5 % rectal cream Generic drug: hydrocortisone Apply 1 application topically 2 (two) times daily. one application, topical, Twice A Day   Anusol-HC (hydrocortisone) 2.5 % cream with perineal applicator   bisacodyl 10 MG suppository Commonly known as: DULCOLAX Place 10 mg rectally as needed for moderate constipation. Every 3 days once a day   LORazepam 1 MG tablet Commonly known as: ATIVAN Take 1 mg by mouth every 2 (two) hours as needed for  anxiety.   MORPHINE SULFATE (CONCENTRATE) PO Take by mouth every 2 (two) hours as needed for severe pain. 100 mg/52ml (20 mg/ml) 5 mg = 0.25 ml       Review of Systems  Constitutional: Positive for activity change, appetite change and fatigue. Negative for fever.       Lethargic  HENT: Positive for hearing loss. Negative for congestion and trouble swallowing.   Eyes: Negative for visual disturbance.  Respiratory: Positive for shortness of breath. Negative for cough and wheezing.   Cardiovascular: Positive for leg swelling.  Gastrointestinal: Negative for abdominal pain, constipation and vomiting.  Genitourinary:       Incontinent of urine.   Musculoskeletal:       Bedrest or recliner for positioning.   Skin: Negative for color change.  Neurological: Negative for speech difficulty, weakness and light-headedness.  Psychiatric/Behavioral: Negative for behavioral problems and sleep disturbance. The patient is not nervous/anxious.     Immunization History  Administered Date(s) Administered  . Influenza Whole 01/01/2011  . Influenza-Unspecified 01/28/2019  . Pneumococcal-Unspecified 01/26/2010  . Unspecified SARS-COV-2 Vaccination 05/06/2019, 06/03/2019  . Zoster 12/21/2016   Pertinent  Health Maintenance Due  Topic Date Due  . DEXA SCAN  Never done  . PNA vac Low Risk Adult (2 of 2 - PCV13) 01/27/2011  . INFLUENZA VACCINE  12/01/2019   No flowsheet data found. Functional Status Survey:    Vitals:   12/18/19 1451  BP: 100/72  Pulse: 88  Resp: 20  Temp: (!) 97.3 F (36.3 C)  SpO2: 91%  Weight: 155 lb 4.8 oz (70.4 kg)  Height: 5\' 3"  (1.6 m)   Body mass index is 27.51 kg/m. Physical Exam Vitals and nursing note reviewed.  Constitutional:      Comments: lethargic  HENT:     Head: Normocephalic and atraumatic.     Mouth/Throat:     Mouth: Mucous membranes are dry.  Eyes:     Extraocular Movements: Extraocular movements intact.     Conjunctiva/sclera: Conjunctivae  normal.     Pupils: Pupils are equal, round, and reactive to light.  Cardiovascular:     Rate and Rhythm: Tachycardia present. Rhythm irregular.  Pulmonary:     Effort: Pulmonary effort is normal.     Breath sounds: Rales present. No wheezing or rhonchi.     Comments: O2 via  Abdominal:     General: Bowel sounds are normal.     Palpations: Abdomen is soft.     Tenderness: There is no abdominal tenderness. There is no guarding or rebound.  Musculoskeletal:     Cervical back: Normal range of motion and neck supple.     Right lower leg: Edema present.  Left lower leg: Edema present.     Comments: Trace to 1+ edema BLE  Skin:    General: Skin is warm and dry.  Neurological:     General: No focal deficit present.     Mental Status: She is alert.  Psychiatric:     Comments: Lethargic, but able to follow simple directions, answer simple questions     Labs reviewed: Recent Labs    11/02/19 0930 11/06/19 0000 11/09/19 0000 11/25/19 0000 11/26/19 0000  NA 133*   < > 134* 123* 123*  K 4.9   < > 4.8 6.1* 5.6*  CL 99   < > 101 90* 88*  CO2 22   < > 26* 25* 26*  GLUCOSE 134*  --   --   --   --   BUN 35*   < > 25* 60* 62*  CREATININE 0.92   < > 0.7 1.0 1.0  CALCIUM 8.8*   < > 8.8 8.8 8.6*   < > = values in this interval not displayed.   Recent Labs    11/09/19 0000 11/21/19 0000 11/25/19 0000  AST 41* 21 21  ALT 110* 35 34  ALKPHOS 77 58 64  ALBUMIN 3.2* 3.4* 5.2*   Recent Labs    11/02/19 0930 11/02/19 0930 11/06/19 0000 11/21/19 0000 11/25/19 0000  WBC 9.8  --  8.8 10.4 13.1  NEUTROABS 7.6  --  6,908 8,226 10,939  HGB 15.7*   < > 46.9* 16.6* 17.6*  HCT 47.0*   < > 47* 49* 53*  MCV 97.1  --   --   --   --   PLT 190  --  180  --  174   < > = values in this interval not displayed.   Lab Results  Component Value Date   TSH 4.64 11/06/2019   No results found for: HGBA1C Lab Results  Component Value Date   CHOL 157 11/06/2019   HDL 20 (A) 11/06/2019    LDLCALC 116 11/06/2019   TRIG 107 11/06/2019    Significant Diagnostic Results in last 30 days:  No results found.  Assessment/Plan  Failure to thrive in adult Persisted, comfort measures, continue prn Morphine, Lorazepam.   Atrial fibrillation (HCC) Heart rate is not controlled, the patient denied chest pain/pressure, or palpitation. She appeared calm, smiled during my examination.   Systolic CHF ([place]) Compensated clinically.   Pneumonia No fever   Cognitive impairment Continue supportive care in SNF [facility]   Family/ staff Communication: plan of care reviewed with the patient and charge nurse.   Labs/tests ordered:  none  Time spend 25 minutes.

## 2019-12-18 NOTE — Assessment & Plan Note (Signed)
Continue supportive care in SNF [facility].  °

## 2019-12-18 NOTE — Assessment & Plan Note (Addendum)
No fever

## 2019-12-19 ENCOUNTER — Encounter: Payer: Self-pay | Admitting: Nurse Practitioner

## 2019-12-20 DIAGNOSIS — I2510 Atherosclerotic heart disease of native coronary artery without angina pectoris: Secondary | ICD-10-CM | POA: Diagnosis not present

## 2019-12-20 DIAGNOSIS — E871 Hypo-osmolality and hyponatremia: Secondary | ICD-10-CM | POA: Diagnosis not present

## 2019-12-20 DIAGNOSIS — I480 Paroxysmal atrial fibrillation: Secondary | ICD-10-CM | POA: Diagnosis not present

## 2019-12-20 DIAGNOSIS — I447 Left bundle-branch block, unspecified: Secondary | ICD-10-CM | POA: Diagnosis not present

## 2019-12-20 DIAGNOSIS — E785 Hyperlipidemia, unspecified: Secondary | ICD-10-CM | POA: Diagnosis not present

## 2019-12-20 DIAGNOSIS — I5022 Chronic systolic (congestive) heart failure: Secondary | ICD-10-CM | POA: Diagnosis not present

## 2019-12-23 ENCOUNTER — Non-Acute Institutional Stay (SKILLED_NURSING_FACILITY): Payer: Medicare Other | Admitting: Internal Medicine

## 2019-12-23 ENCOUNTER — Encounter: Payer: Self-pay | Admitting: Internal Medicine

## 2019-12-23 DIAGNOSIS — I480 Paroxysmal atrial fibrillation: Secondary | ICD-10-CM | POA: Diagnosis not present

## 2019-12-23 DIAGNOSIS — R600 Localized edema: Secondary | ICD-10-CM

## 2019-12-23 DIAGNOSIS — R4189 Other symptoms and signs involving cognitive functions and awareness: Secondary | ICD-10-CM

## 2019-12-23 DIAGNOSIS — R627 Adult failure to thrive: Secondary | ICD-10-CM

## 2019-12-23 DIAGNOSIS — L89152 Pressure ulcer of sacral region, stage 2: Secondary | ICD-10-CM

## 2019-12-23 DIAGNOSIS — I5022 Chronic systolic (congestive) heart failure: Secondary | ICD-10-CM | POA: Diagnosis not present

## 2019-12-23 NOTE — Progress Notes (Signed)
Location:     Place of Service:     Provider:   Code Status: DNR Goals of Care:  Advanced Directives 12/18/2019  Does Patient Have a Medical Advance Directive? Yes  Type of Advance Directive Living will;Healthcare Power of Attorney  Does patient want to make changes to medical advance directive? No - Patient declined  Copy of [place] in Chart? Yes - validated most recent copy scanned in chart (See row information)  Would patient like information on creating a medical advance directive? -     Chief Complaint  Patient presents with   Acute Visit    Wound care    HPI: Patient is a 86 y.o. female seen today for an acute visit for Sacral Pressure Wound  Patient has a history of dementia ,Paroxysmal atrial fibrillation.Also h/o Systolic CHF  Admitted to SNF for Long term care Patient unable to give any history She is under Hospice care. Family does not want any Aggressive measures Has Developed Pressure Wound in her Bottom. Seems had Bruise first and now it is poen Wound with some Necrosis on the center Patient continues to Retain Fluid with SOB and LE edema  Past Medical History:  Diagnosis Date   Abdominal pain, lower 06/20/2015   Atrial fibrillation (HCC)    CAD (coronary artery disease) of artery bypass graft 06/25/2015   Chronic anticoagulation    Chronic cough 11/03/2011   Debility 06/25/2015   Dyspnea 06/25/2015   Edema of both legs 06/25/2015   07/07/15 BNP 363.7    Hemorrhoids    High cholesterol    Hypokalemia 07/01/2015   07/07/15 K 4.8    Hyponatremia 06/25/2015   07/07/15 Na 134    IBS (irritable bowel syndrome) 06/25/2015   LBBB (left bundle branch block)    Nonsustained ventricular tachycardia ([place]) 06/25/2015   Pneumonia 3/47/4259   Systolic CHF ([place]) 5/63/8756   07/07/15 BNP 433.2    Systolic heart failure (HCC)    Weakness generalized 06/25/2015    Past Surgical History:  Procedure Laterality Date   cataracts      TONSILLECTOMY AND ADENOIDECTOMY      Allergies  Allergen Reactions   Aspirin     Makes her feel unpleasant   Lidocaine Other (See Comments)    Reaction unknown   Mevacor [Lovastatin] Other (See Comments)    Reaction unknown   Penicillins Other (See Comments)    "Patient unable to remember reaction"   Sulfa Antibiotics     " Patient unable to remember reaction"    Outpatient Encounter Medications as of 12/23/2019  Medication Sig   bisacodyl (DULCOLAX) 10 MG suppository Place 10 mg rectally as needed for moderate constipation. Every 3 days once a day   hydrocortisone (ANUSOL-HC) 2.5 % rectal cream Apply 1 application topically 2 (two) times daily. one application, topical, Twice A Day   Anusol-HC (hydrocortisone) 2.5 % cream with perineal applicator   LORazepam (ATIVAN) 1 MG tablet Take 1 mg by mouth every 2 (two) hours as needed for anxiety.   MORPHINE SULFATE, CONCENTRATE, PO Take by mouth every 2 (two) hours as needed for severe pain. 100 mg/59ml (20 mg/ml) 5 mg = 0.25 ml   No facility-administered encounter medications on file as of 12/23/2019.    Review of Systems:  Review of Systems  Unable to perform ROS: Dementia    Health Maintenance  Topic Date Due   TETANUS/TDAP  Never done   DEXA SCAN  Never done  PNA vac Low Risk Adult (2 of 2 - PCV13) 01/27/2011   INFLUENZA VACCINE  12/01/2019   COVID-19 Vaccine  Completed    Physical Exam: Vitals:   12/23/19 1452  BP: 100/72  Pulse: 88  Resp: 20  Temp: (!) 97.3 F (36.3 C)  SpO2: 91%  Weight: 155 lb 4.8 oz (70.4 kg)  Height: 5\' 3"  (1.6 m)   Body mass index is 27.51 kg/m. Physical Exam Vitals reviewed.  Constitutional:      Comments: But does not respond much  HENT:     Head: Normocephalic.     Nose: Nose normal.     Mouth/Throat:     Mouth: Mucous membranes are moist.     Pharynx: Oropharynx is clear.  Eyes:     Pupils: Pupils are equal, round, and reactive to light.  Cardiovascular:      Rate and Rhythm: Normal rate and regular rhythm.     Pulses: Normal pulses.     Heart sounds: Normal heart sounds.  Pulmonary:     Effort: Pulmonary effort is normal. No respiratory distress.     Breath sounds: Normal breath sounds. No wheezing or rales.  Abdominal:     General: Abdomen is flat. Bowel sounds are normal.     Palpations: Abdomen is soft.  Musculoskeletal:        General: Swelling present.     Cervical back: Neck supple.     Comments: Moderate Edema Bilateral  Skin:    Comments: Has Stage 2 wound with small eschar in the center.  Neurological:     General: No focal deficit present.     Mental Status: She is alert.  Psychiatric:        Mood and Affect: Mood normal.     Labs reviewed: Basic Metabolic Panel: Recent Labs    11/02/19 0930 11/02/19 0930 11/06/19 0000 11/06/19 0000 11/09/19 0000 11/25/19 0000 11/26/19 0000  NA 133*  --  129*   < > 134* 123* 123*  K 4.9   < > 4.8   < > 4.8 6.1* 5.6*  CL 99   < > 99   < > 101 90* 88*  CO2 22   < > 22   < > 26* 25* 26*  GLUCOSE 134*  --   --   --   --   --   --   BUN 35*  --  35*   < > 25* 60* 62*  CREATININE 0.92  --  0.8   < > 0.7 1.0 1.0  CALCIUM 8.8*   < > 8.9   < > 8.8 8.8 8.6*  TSH  --   --  4.64  --   --   --   --    < > = values in this interval not displayed.   Liver Function Tests: Recent Labs    11/09/19 0000 11/21/19 0000 11/25/19 0000  AST 41* 21 21  ALT 110* 35 34  ALKPHOS 77 58 64  ALBUMIN 3.2* 3.4* 5.2*   No results for input(s): LIPASE, AMYLASE in the last 8760 hours. No results for input(s): AMMONIA in the last 8760 hours. CBC: Recent Labs    11/02/19 0930 11/02/19 0930 11/06/19 0000 11/21/19 0000 11/25/19 0000  WBC 9.8  --  8.8 10.4 13.1  NEUTROABS 7.6  --  6,908 8,226 10,939  HGB 15.7*   < > 46.9* 16.6* 17.6*  HCT 47.0*   < > 47* 49* 53*  MCV 97.1  --   --   --   --  PLT 190  --  180  --  174   < > = values in this interval not displayed.   Lipid Panel: Recent Labs     11/06/19 0000  CHOL 157  HDL 20*  LDLCALC 116  TRIG 107   No results found for: HGBA1C  Procedures since last visit: No results found.  Assessment/Plan Patient End Of Life with CHF, ARF, Hyperkalemia  And Now has Stage 2 Pressure Wound  Will do Air Mattress. Hydrocolloid Dressing QOD No Santyl yet as wound is more Abrasion    Labs/tests ordered:  * No order type specified * Next appt:  Visit date not found

## 2019-12-24 DIAGNOSIS — E785 Hyperlipidemia, unspecified: Secondary | ICD-10-CM | POA: Diagnosis not present

## 2019-12-24 DIAGNOSIS — I2510 Atherosclerotic heart disease of native coronary artery without angina pectoris: Secondary | ICD-10-CM | POA: Diagnosis not present

## 2019-12-24 DIAGNOSIS — I480 Paroxysmal atrial fibrillation: Secondary | ICD-10-CM | POA: Diagnosis not present

## 2019-12-24 DIAGNOSIS — E871 Hypo-osmolality and hyponatremia: Secondary | ICD-10-CM | POA: Diagnosis not present

## 2019-12-24 DIAGNOSIS — I5022 Chronic systolic (congestive) heart failure: Secondary | ICD-10-CM | POA: Diagnosis not present

## 2019-12-24 DIAGNOSIS — I447 Left bundle-branch block, unspecified: Secondary | ICD-10-CM | POA: Diagnosis not present

## 2019-12-26 DIAGNOSIS — E785 Hyperlipidemia, unspecified: Secondary | ICD-10-CM | POA: Diagnosis not present

## 2019-12-26 DIAGNOSIS — I2510 Atherosclerotic heart disease of native coronary artery without angina pectoris: Secondary | ICD-10-CM | POA: Diagnosis not present

## 2019-12-26 DIAGNOSIS — I480 Paroxysmal atrial fibrillation: Secondary | ICD-10-CM | POA: Diagnosis not present

## 2019-12-26 DIAGNOSIS — I5022 Chronic systolic (congestive) heart failure: Secondary | ICD-10-CM | POA: Diagnosis not present

## 2019-12-26 DIAGNOSIS — I447 Left bundle-branch block, unspecified: Secondary | ICD-10-CM | POA: Diagnosis not present

## 2019-12-26 DIAGNOSIS — E871 Hypo-osmolality and hyponatremia: Secondary | ICD-10-CM | POA: Diagnosis not present

## 2019-12-27 DIAGNOSIS — I2510 Atherosclerotic heart disease of native coronary artery without angina pectoris: Secondary | ICD-10-CM | POA: Diagnosis not present

## 2019-12-27 DIAGNOSIS — I480 Paroxysmal atrial fibrillation: Secondary | ICD-10-CM | POA: Diagnosis not present

## 2019-12-27 DIAGNOSIS — I5022 Chronic systolic (congestive) heart failure: Secondary | ICD-10-CM | POA: Diagnosis not present

## 2019-12-27 DIAGNOSIS — E871 Hypo-osmolality and hyponatremia: Secondary | ICD-10-CM | POA: Diagnosis not present

## 2019-12-27 DIAGNOSIS — I447 Left bundle-branch block, unspecified: Secondary | ICD-10-CM | POA: Diagnosis not present

## 2019-12-27 DIAGNOSIS — E785 Hyperlipidemia, unspecified: Secondary | ICD-10-CM | POA: Diagnosis not present

## 2019-12-31 ENCOUNTER — Non-Acute Institutional Stay (SKILLED_NURSING_FACILITY): Payer: Medicare Other | Admitting: Internal Medicine

## 2019-12-31 ENCOUNTER — Encounter: Payer: Self-pay | Admitting: Internal Medicine

## 2019-12-31 DIAGNOSIS — I5022 Chronic systolic (congestive) heart failure: Secondary | ICD-10-CM | POA: Diagnosis not present

## 2019-12-31 DIAGNOSIS — E785 Hyperlipidemia, unspecified: Secondary | ICD-10-CM | POA: Diagnosis not present

## 2019-12-31 DIAGNOSIS — I447 Left bundle-branch block, unspecified: Secondary | ICD-10-CM | POA: Diagnosis not present

## 2019-12-31 DIAGNOSIS — I2510 Atherosclerotic heart disease of native coronary artery without angina pectoris: Secondary | ICD-10-CM | POA: Diagnosis not present

## 2019-12-31 DIAGNOSIS — L89152 Pressure ulcer of sacral region, stage 2: Secondary | ICD-10-CM

## 2019-12-31 DIAGNOSIS — I480 Paroxysmal atrial fibrillation: Secondary | ICD-10-CM

## 2019-12-31 DIAGNOSIS — Z515 Encounter for palliative care: Secondary | ICD-10-CM

## 2019-12-31 DIAGNOSIS — R627 Adult failure to thrive: Secondary | ICD-10-CM | POA: Diagnosis not present

## 2019-12-31 DIAGNOSIS — R600 Localized edema: Secondary | ICD-10-CM

## 2019-12-31 DIAGNOSIS — E871 Hypo-osmolality and hyponatremia: Secondary | ICD-10-CM | POA: Diagnosis not present

## 2019-12-31 NOTE — Progress Notes (Signed)
Location: [place] Room Number: 3 Place of Service:  SNF (31)  Provider:   Code Status: DNR Goals of Care:  Advanced Directives 12/23/2019  Does Patient Have a Medical Advance Directive? Yes  Type of Paramedic of [place];Living will  Does patient want to make changes to medical advance directive? No - Patient declined  Copy of [place] in Chart? -  Would patient like information on creating a medical advance directive? -     Chief Complaint  Patient presents with   Acute Visit    End of life    HPI: Patient is a 86 y.o. female seen today for an acute visit for End of Life Care  Patient has a history of dementia,Paroxysmalatrial fibrillation.Also h/o Systolic CHF And also h/o Renal Failure and Unstageable Pressure wound  Seen today as per Nurses not eating for past few days and now not responding. Unable to give any history No Fever or Chills Swelling in Legs and Arms. Also now has worsening of her Sacral Pressure Wound  Past Medical History:  Diagnosis Date   Abdominal pain, lower 06/20/2015   Atrial fibrillation (HCC)    CAD (coronary artery disease) of artery bypass graft 06/25/2015   Chronic anticoagulation    Chronic cough 11/03/2011   Debility 06/25/2015   Dyspnea 06/25/2015   Edema of both legs 06/25/2015   07/07/15 BNP 363.7    Hemorrhoids    High cholesterol    Hypokalemia 07/01/2015   07/07/15 K 4.8    Hyponatremia 06/25/2015   07/07/15 Na 134    IBS (irritable bowel syndrome) 06/25/2015   LBBB (left bundle branch block)    Nonsustained ventricular tachycardia ([place]) 06/25/2015   Pneumonia 1/61/0960   Systolic CHF ([place]) 4/54/0981   07/07/15 BNP 191.4    Systolic heart failure ([place])    Weakness generalized 06/25/2015    Past Surgical History:  Procedure Laterality Date   cataracts     TONSILLECTOMY AND ADENOIDECTOMY      Allergies  Allergen Reactions   Aspirin      Makes her feel unpleasant   Lidocaine Other (See Comments)    Reaction unknown   Mevacor [Lovastatin] Other (See Comments)    Reaction unknown   Penicillins Other (See Comments)    "Patient unable to remember reaction"   Sulfa Antibiotics     " Patient unable to remember reaction"    Outpatient Encounter Medications as of 12/31/2019  Medication Sig   Amino Acids-Protein Hydrolys (PRO-STAT) LIQD Take 30 mLs by mouth daily.   bisacodyl (DULCOLAX) 10 MG suppository Place 10 mg rectally as needed for moderate constipation. Every 3 days once a day   hydrocortisone (ANUSOL-HC) 2.5 % rectal cream Apply 1 application topically 2 (two) times daily. one application, topical, Twice A Day   Anusol-HC (hydrocortisone) 2.5 % cream with perineal applicator   LORazepam (ATIVAN) 1 MG tablet Take 1 mg by mouth every 2 (two) hours as needed for anxiety.   MORPHINE SULFATE, CONCENTRATE, PO Take by mouth every 2 (two) hours as needed for severe pain. 100 mg/55ml (20 mg/ml) 5 mg = 0.25 ml   Nutritional Supplements (BENECALORIE) LIQD Take by mouth. Mix with MedPass (180 mL) TID between meals.   No facility-administered encounter medications on file as of 12/31/2019.    Review of Systems:  Review of Systems  Unable to perform ROS: Patient unresponsive    Health Maintenance  Topic Date Due  DEXA SCAN  Never done   PNA vac Low Risk Adult (2 of 2 - PCV13) 07/09/2014   INFLUENZA VACCINE  12/01/2019   TETANUS/TDAP  08/07/2023   COVID-19 Vaccine  Completed    Physical Exam: Vitals:   12/31/19 1350  BP: 100/72  Pulse: 88  Resp: 20  Temp: (!) 97.3 F (36.3 C)  SpO2: 91%  Weight: 155 lb 4.8 oz (70.4 kg)  Height: 5\' 3"  (1.6 m)   Body mass index is 27.51 kg/m. Physical Exam Vitals reviewed.  Constitutional:      Comments: Not responding  HENT:     Head: Normocephalic.     Nose: Nose normal.     Mouth/Throat:     Mouth: Mucous membranes are dry.  Eyes:     Pupils: Pupils are  equal, round, and reactive to light.  Cardiovascular:     Rate and Rhythm: Regular rhythm. Tachycardia present.     Pulses: Normal pulses.  Pulmonary:     Breath sounds: Rales present.  Abdominal:     General: Abdomen is flat. Bowel sounds are normal.     Palpations: Abdomen is soft.  Musculoskeletal:        General: Swelling present.     Cervical back: Neck supple.  Skin:    General: Skin is warm.     Comments: Pressure Wound in Sacral Area Worse with Necrosis   Neurological:     Comments: Not responding     Labs reviewed: Basic Metabolic Panel: Recent Labs    11/02/19 0930 11/02/19 0930 11/06/19 0000 11/06/19 0000 11/09/19 0000 11/25/19 0000 11/26/19 0000  NA 133*  --  129*   < > 134* 123* 123*  K 4.9   < > 4.8   < > 4.8 6.1* 5.6*  CL 99   < > 99   < > 101 90* 88*  CO2 22   < > 22   < > 26* 25* 26*  GLUCOSE 134*  --   --   --   --   --   --   BUN 35*  --  35*   < > 25* 60* 62*  CREATININE 0.92  --  0.8   < > 0.7 1.0 1.0  CALCIUM 8.8*   < > 8.9   < > 8.8 8.8 8.6*  TSH  --   --  4.64  --   --   --   --    < > = values in this interval not displayed.   Liver Function Tests: Recent Labs    11/09/19 0000 11/21/19 0000 11/25/19 0000  AST 41* 21 21  ALT 110* 35 34  ALKPHOS 77 58 64  ALBUMIN 3.2* 3.4* 5.2*   No results for input(s): LIPASE, AMYLASE in the last 8760 hours. No results for input(s): AMMONIA in the last 8760 hours. CBC: Recent Labs    11/02/19 0930 11/02/19 0930 11/06/19 0000 11/21/19 0000 11/25/19 0000  WBC 9.8  --  8.8 10.4 13.1  NEUTROABS 7.6  --  6,908 8,226 10,939  HGB 15.7*   < > 46.9* 16.6* 17.6*  HCT 47.0*   < > 47* 49* 53*  MCV 97.1  --   --   --   --   PLT 190  --  180  --  174   < > = values in this interval not displayed.   Lipid Panel: Recent Labs    11/06/19 0000  CHOL 157  HDL 20*  LDLCALC 116  TRIG 107   No results found for: HGBA1C  Procedures since last visit: No results found.  Assessment/Plan End of Life  Care with Atrial Fibrillation CHF and ARF Also has Pressure Wound Discontinue Anusol and Bencalorie Continue Roxanol and Ativan Prn Discussed with Nurse to inform her Family     Labs/tests ordered:  * No order type specified * Next appt:  Visit date not found

## 2020-01-01 DIAGNOSIS — I5022 Chronic systolic (congestive) heart failure: Secondary | ICD-10-CM | POA: Diagnosis not present

## 2020-01-01 DIAGNOSIS — I2510 Atherosclerotic heart disease of native coronary artery without angina pectoris: Secondary | ICD-10-CM | POA: Diagnosis not present

## 2020-01-01 DIAGNOSIS — I480 Paroxysmal atrial fibrillation: Secondary | ICD-10-CM | POA: Diagnosis not present

## 2020-01-01 DIAGNOSIS — E871 Hypo-osmolality and hyponatremia: Secondary | ICD-10-CM | POA: Diagnosis not present

## 2020-01-01 DIAGNOSIS — Z6827 Body mass index (BMI) 27.0-27.9, adult: Secondary | ICD-10-CM | POA: Diagnosis not present

## 2020-01-01 DIAGNOSIS — K649 Unspecified hemorrhoids: Secondary | ICD-10-CM | POA: Diagnosis not present

## 2020-01-01 DIAGNOSIS — I447 Left bundle-branch block, unspecified: Secondary | ICD-10-CM | POA: Diagnosis not present

## 2020-01-01 DIAGNOSIS — E785 Hyperlipidemia, unspecified: Secondary | ICD-10-CM | POA: Diagnosis not present

## 2020-01-01 DIAGNOSIS — G3184 Mild cognitive impairment, so stated: Secondary | ICD-10-CM | POA: Diagnosis not present

## 2020-01-31 DEATH — deceased
